# Patient Record
Sex: Male | Born: 1985
Health system: Southern US, Community
[De-identification: ages and names within clinical notes are randomized; demographics above are authoritative.]

## PROBLEM LIST (undated history)

## (undated) DIAGNOSIS — J45909 Unspecified asthma, uncomplicated: Secondary | ICD-10-CM

## (undated) HISTORY — PX: HEMORRHOID SURGERY: SHX153

---

## 2013-05-12 ENCOUNTER — Emergency Department (HOSPITAL_COMMUNITY)
Admission: EM | Admit: 2013-05-12 | Discharge: 2013-05-12 | Disposition: A | Discharge status: Home / Self Care | Payer: No Typology Code available for payment source | Attending: Emergency Medicine | Admitting: Emergency Medicine

## 2013-05-12 ENCOUNTER — Encounter (HOSPITAL_COMMUNITY): Payer: Self-pay | Admitting: Emergency Medicine

## 2013-05-12 DIAGNOSIS — Y929 Unspecified place or not applicable: Secondary | ICD-10-CM | POA: Insufficient documentation

## 2013-05-12 DIAGNOSIS — W57XXXA Bitten or stung by nonvenomous insect and other nonvenomous arthropods, initial encounter: Secondary | ICD-10-CM | POA: Insufficient documentation

## 2013-05-12 DIAGNOSIS — Y939 Activity, unspecified: Secondary | ICD-10-CM | POA: Insufficient documentation

## 2013-05-12 DIAGNOSIS — S40269A Insect bite (nonvenomous) of unspecified shoulder, initial encounter: Secondary | ICD-10-CM | POA: Insufficient documentation

## 2013-05-12 DIAGNOSIS — S30860A Insect bite (nonvenomous) of lower back and pelvis, initial encounter: Secondary | ICD-10-CM | POA: Insufficient documentation

## 2013-05-12 DIAGNOSIS — B379 Candidiasis, unspecified: Secondary | ICD-10-CM | POA: Insufficient documentation

## 2013-05-12 DIAGNOSIS — S90569A Insect bite (nonvenomous), unspecified ankle, initial encounter: Secondary | ICD-10-CM | POA: Insufficient documentation

## 2013-05-12 DIAGNOSIS — B372 Candidiasis of skin and nail: Secondary | ICD-10-CM

## 2013-05-12 MED ORDER — LIDOCAINE HCL (CARDIAC) 20 MG/ML IV SOLN
INTRAVENOUS | Status: AC
  Filled 2013-05-12: qty 5

## 2013-05-12 MED ORDER — ETOMIDATE 2 MG/ML IV SOLN
INTRAVENOUS | Status: AC
  Filled 2013-05-12: qty 20

## 2013-05-12 MED ORDER — SUCCINYLCHOLINE CHLORIDE 20 MG/ML IJ SOLN
INTRAMUSCULAR | Status: AC
  Filled 2013-05-12: qty 1

## 2013-05-12 MED ORDER — HYDROCORTISONE 2.5 % EX LOTN: TOPICAL_LOTION | Freq: Two times a day (BID) | CUTANEOUS | Status: DC

## 2013-05-12 MED ORDER — ROCURONIUM BROMIDE 50 MG/5ML IV SOLN
INTRAVENOUS | Status: AC
  Filled 2013-05-12: qty 2

## 2013-05-12 MED ORDER — TERBINAFINE HCL 1 % EX CREA: TOPICAL_CREAM | Freq: Two times a day (BID) | CUTANEOUS | Status: DC

## 2013-05-12 NOTE — ED Notes (Signed)
Pt here from [place]  one month ago  now has a rash of small bumps on his arms , legs and groin , pt states that they itch but do not hurt ,no drainage

## 2013-05-12 NOTE — ED Provider Notes (Signed)
CSN: [id number]     Arrival date & time 05/12/13  1640 History  This chart was scribed for non-physician practitioner Dierdre Forth, PA-C, working with Audree Camel, MD, by Yevette Edwards, ED Scribe. This patient was seen in room TR01C/TR01C and the patient's care was started at 5:23 PM.   First MD Initiated Contact with Patient 05/12/13 1710     Chief Complaint  Patient presents with  . Rash    Patient is a 27 y.o. male presenting with rash. The history is provided by the patient. The history is limited by a language barrier (family translating without difficulty). No language interpreter was used.  Rash Location:  Shoulder/arm, leg and ano-genital Quality: itchiness   Quality: not painful and not weeping   Relieved by:  None tried Ineffective treatments:  None tried Associated symptoms: no fever, no nausea and not vomiting    HPI Comments: Watson Adamou Ellis is a 27 y.o. male who presents to the Emergency Department complaining of a rash to his arms bilaterally, legs bilaterally, and groin. He has experienced the rash for approximately a year, and in the previous year the rash has gradually improved. The rash has not changed its appearance in the course of the year. The pt reports experiencing itching associated with the rash.  He denies experiencing any itching to his toes, but does have itching to his heels and ankles. He denies experiencing any pain or drainage associated with the rash. He also denies experiencing any fever, chills, nausea, or emesis. The pt has not attempted to mitigate the rash with any treatment. He denies any medical issues or taking any medication.   History reviewed. No pertinent past medical history. History reviewed. No pertinent past surgical history. No family history on file. History  Substance Use Topics  . Smoking status: Not on file  . Smokeless tobacco: Not on file  . Alcohol Use: No    Review of Systems  Constitutional: Negative for  fever and chills.  Gastrointestinal: Negative for nausea and vomiting.  Skin: Positive for rash.  All other systems reviewed and are negative.    Allergies  Review of patient's allergies indicates not on file.  Home Medications   Current Outpatient Rx  Name  Route  Sig  Dispense  Refill  . hydrocortisone 2.5 % lotion   Topical   Apply topically 2 (two) times daily. To the arms, legs and trunk.  Avoid the groin area completely   59 mL   0   . terbinafine (LAMISIL AT) 1 % cream   Topical   Apply topically 2 (two) times daily. To the groin area only   30 g   0     Triage Vitals: BP 142/79  Pulse 72  Temp(Src) 98.9 F (37.2 C) (Oral)  Resp 18  SpO2 100%  Physical Exam  Nursing note and vitals reviewed. Constitutional: He appears well-developed and well-nourished. No distress.  Awake, alert, nontoxic appearance  HENT:  Head: Normocephalic and atraumatic.  Mouth/Throat: Oropharynx is clear and moist. No oropharyngeal exudate.  Eyes: Conjunctivae are normal. No scleral icterus.  Neck: Normal range of motion. Neck supple.  Cardiovascular: Normal rate, regular rhythm, normal heart sounds and intact distal pulses.   No murmur heard. Pulmonary/Chest: Effort normal and breath sounds normal. No respiratory distress. He has no wheezes. He has no rales.  Abdominal: Soft. Bowel sounds are normal. He exhibits no distension and no mass. There is no tenderness. There is no rebound and no guarding.  Genitourinary:     Mildly erythematous plaques with fine scaling and several small satellite lesions located of the inguinal folds of the groin bilaterally. There is no rash on the scrotum.  Musculoskeletal: Normal range of motion. He exhibits no edema.  Neurological: He is alert.  Speech is clear and goal oriented Moves extremities without ataxia  Skin: Skin is warm and dry. Rash noted. He is not diaphoretic. There is erythema.  Scattered erythematous, excoriated papules over the  arms, legs, and trunk. There is no rash to the axilla.   Psychiatric: He has a normal mood and affect.    ED Course   DIAGNOSTIC STUDIES:  Oxygen Saturation is 100% on room air, normal by my interpretation.    COORDINATION OF CARE:  5:28 PM- Discussed treatment plan with patient which includes medication and a follow-up with a dermatologist, and the patient agreed to the plan.   Procedures (including critical care time)  Labs Reviewed - No data to display No results found. 1. Candidal intertrigo   2. Insect bites     MDM  Noris Adamou Ricke presents with 2 different rashes on the skin present for > 1 year. Trigone rash consistent with candida and without evidence of cellulitis.  Extremity and trunk rash is consistent with insect bites, but strictly consistent with scabies or bed bugs.  Neither rash is consistent with parasite infection, cellulitis or abscess.  Extremity rash is excoriated.  Will treat with hydrocortisone for the extremity and trunk rash and Lamisil for the trigone rash.  Pt advised not to confuse these as the hydrocortisone will make the groin rash worse.  Pt also given dermatology follow-up.  I have also discussed reasons to return immediately to the ER.  Patient expresses understanding and agrees with plan.    Dahlia Client Sandie Swayze, PA-C 05/12/13 1758

## 2013-05-13 NOTE — ED Provider Notes (Signed)
Medical screening examination/treatment/procedure(s) were performed by non-physician practitioner and as supervising physician I was immediately available for consultation/collaboration.   Yuval Nolet T Nikkol Pai, MD 05/13/13 0004 

## 2014-09-16 ENCOUNTER — Emergency Department (HOSPITAL_COMMUNITY)
Admission: EM | Admit: 2014-09-16 | Discharge: 2014-09-16 | Disposition: A | Discharge status: Home / Self Care | Payer: PRIVATE HEALTH INSURANCE | Attending: Emergency Medicine | Admitting: Emergency Medicine

## 2014-09-16 ENCOUNTER — Encounter (HOSPITAL_COMMUNITY): Payer: Self-pay | Admitting: *Deleted

## 2014-09-16 DIAGNOSIS — Z79899 Other long term (current) drug therapy: Secondary | ICD-10-CM | POA: Diagnosis not present

## 2014-09-16 DIAGNOSIS — R21 Rash and other nonspecific skin eruption: Secondary | ICD-10-CM | POA: Diagnosis not present

## 2014-09-16 MED ORDER — HYDROCORTISONE 2.5 % EX LOTN: TOPICAL_LOTION | Freq: Two times a day (BID) | CUTANEOUS | Status: DC

## 2014-09-16 NOTE — Discharge Instructions (Signed)
Return to the emergency room with worsening of symptoms, new symptoms or with symptoms that are concerning. Apply hydrocortisone lotion to the rash twice daily. Take benadryl for itching but it can make you drowsy. No driving, operating machinery drinking call or other sedating meds while taking Benadryl. You may take Claritin, Zyrtec, Allegra for the itching and it will not make you drowsy. Call to make an appointment with the wellness center to establish care. Do not scratch the area.  Contact Dermatitis Contact dermatitis is a rash that happens when something touches the skin. You touched something that irritates your skin, or you have allergies to something you touched. HOME CARE   Avoid the thing that caused your rash.  Keep your rash away from hot water, soap, sunlight, chemicals, and other things that might bother it.  Do not scratch your rash.  You can take cool baths to help stop itching.  Only take medicine as told by your doctor.  Keep all doctor visits as told. GET HELP RIGHT AWAY IF:   Your rash is not better after 3 days.  Your rash gets worse.  Your rash is puffy (swollen), tender, red, sore, or warm.  You have problems with your medicine. MAKE SURE YOU:   Understand these instructions.  Will watch your condition.  Will get help right away if you are not doing well or get worse. Document Released: 07/20/2009 Document Revised: 12/15/2011 Document Reviewed: 02/25/2011 [facility] Patient Information 2015 [place]. This information is not intended to replace advice given to you by your health care provider. Make sure you discuss any questions you have with your health care provider.

## 2014-09-16 NOTE — ED Provider Notes (Signed)
CSN: [id number]     Arrival date & time 09/16/14  1127 History   This chart was scribed for non-physician practitioner, Karmen Stabsori Marika Mahaffy, PA-C working with Gerhard Munchobert Lockwood, MD, by Jarvis Morganaylor Ferguson, ED Scribe. This patient was seen in room TR09C/TR09C and the patient's care was started at 12:26 PM.    Chief Complaint  Patient presents with  . Rash    The history is provided by the patient. A language interpreter was used.    HPI Comments: David Larsen is a 28 y.o. male with no PMH who presents to the Emergency Department complaining of a rash to the back of his neck. Pt states he has had a rash like this in the past but he has not been taking anything or applying any cream on the rash. He states that the rash is mildly itchy. He states that the rash is not painful. Pt notes there is some discharge from the rash but unable to specify was type of discharge. He denies using any new soap, deodorants, or detergents or new shirts. No insect bites. Pt reports that the rash is not on any other part of his body. He denies any fevers, chills, nausea or vomiting.    History reviewed. No pertinent past medical history. History reviewed. No pertinent past surgical history. History reviewed. No pertinent family history. History  Substance Use Topics  . Smoking status: Not on file  . Smokeless tobacco: Not on file  . Alcohol Use: No    Review of Systems  Constitutional: Negative for fever and chills.  Gastrointestinal: Negative for nausea and vomiting.  Skin: Positive for rash. Negative for color change and wound.      Allergies  Review of patient's allergies indicates no known allergies.  Home Medications   Prior to Admission medications   Medication Sig Start Date End Date Taking? Authorizing Provider  hydrocortisone 2.5 % lotion Apply topically 2 (two) times daily. 09/16/14   Louann SjogrenVictoria L Camyra Vaeth, PA-C  terbinafine (LAMISIL AT) 1 % cream Apply topically 2 (two) times daily. To the groin  area only 05/12/13   Dahlia ClientHannah Muthersbaugh, PA-C   Triage Vitals: BP 122/70 mmHg  Pulse 73  Temp(Src) 97.3 F (36.3 C) (Oral)  Resp 15  SpO2 96%  Physical Exam  Constitutional: He appears well-developed and well-nourished. No distress.  HENT:  Head: Normocephalic and atraumatic.  Mouth/Throat: Oropharynx is clear and moist.  No swelling, oral swelling or tongue swelling. No tenderness under tongue. No oral lesions.  Eyes: Conjunctivae are normal. Right eye exhibits no discharge. Left eye exhibits no discharge.  Pulmonary/Chest: Effort normal. No respiratory distress.  Neurological: He is alert. Coordination normal.  Skin: He is not diaphoretic.  2-3 mm raised, erythematous, circular, lesions without fluctuance or evidence of cellulitis. No discharge. Lesions soft and mobile. Lesions at bottom of hairline where collar of shirt contacts head.   Nursing note and vitals reviewed.   ED Course  Procedures (including critical care time)  DIAGNOSTIC STUDIES: Oxygen Saturation is 96% on RA, adequate by my interpretation.    COORDINATION OF CARE:    Labs Review Labs Reviewed - No data to display  Imaging Review No results found.   EKG Interpretation None      MDM   Final diagnoses:  Rash of neck   Patient with pruritic rash at collar line, likely due to contact dermatitis but know known cause identified. Lesions do not appear to be insect bites, abscesses, cellulitis.  No fevers. No difficulty breathing,  throat tightness. VSS. No mucous membrane involvement. Tx with cortisone cream, antihistamines and symptomatic treatment. Follow up with wellness center for persistent symptoms.  Discussed return precautions with patient. Discussed all results and patient verbalizes understanding and agrees with plan.  I personally performed the services described in this documentation, which was scribed in my presence. The recorded information has been reviewed and is accurate.    Louann SjogrenVictoria  L Dickie Labarre, PA-C 09/16/14 1303  Gerhard Munchobert Lockwood, MD 09/16/14 [id number]

## 2014-09-16 NOTE — ED Notes (Signed)
Pt reports rash to back of neck, hair line. Sometimes itching, painful, feel hot.

## 2016-12-12 ENCOUNTER — Emergency Department (HOSPITAL_COMMUNITY)
Admission: EM | Admit: 2016-12-12 | Discharge: 2016-12-12 | Disposition: A | Discharge status: Home / Self Care | Payer: BLUE CROSS/BLUE SHIELD | Attending: Emergency Medicine | Admitting: Emergency Medicine

## 2016-12-12 ENCOUNTER — Encounter (HOSPITAL_COMMUNITY): Payer: Self-pay | Admitting: Emergency Medicine

## 2016-12-12 DIAGNOSIS — L299 Pruritus, unspecified: Secondary | ICD-10-CM | POA: Insufficient documentation

## 2016-12-12 MED ORDER — FAMOTIDINE 20 MG PO TABS: 20.0000 mg | ORAL_TABLET | Freq: Two times a day (BID) | ORAL | 0 refills | Status: DC

## 2016-12-12 MED ORDER — HYDROXYZINE HCL 25 MG PO TABS: 25.0000 mg | ORAL_TABLET | Freq: Four times a day (QID) | ORAL | 0 refills | Status: DC

## 2016-12-12 MED ORDER — PREDNISONE 10 MG PO TABS: 20.0000 mg | ORAL_TABLET | Freq: Two times a day (BID) | ORAL | 0 refills | Status: DC

## 2016-12-12 NOTE — ED Provider Notes (Signed)
[facility]-EMERGENCY DEPT Provider Note   CSN: [id number] Arrival date & time: 12/12/16  1442  By signing my name below, I, Doreatha Martin, attest that this documentation has been prepared under the direction and in the presence of  [facility] M. Damian Leavell, NP. Electronically Signed: Doreatha Martin, ED Scribe. 12/12/16. 4:30 PM.    History   Chief Complaint Chief Complaint  Patient presents with  . Pruritis    HPI David Larsen is a 31 y.o. male who presents to the Emergency Department complaining of moderate generalized itching to the entire body for 1 month that worsened last night. No new soaps, lotions, detergents, foods, animals, plants, medications, clothing, cologne. Per pt, his itching began on his arms. Pt denies taking OTC medications at home to improve symptoms. He denies nausea, vomiting, fever, rash, throat swelling, sore throat, difficulty swallowing, rash/itching to web spacing of fingers.     The history is provided by the patient. No language interpreter was used.  Illness  This is a new problem. The current episode started more than 1 week ago. The problem occurs daily. The problem has been gradually worsening. Nothing aggravates the symptoms. Nothing relieves the symptoms. He has tried nothing for the symptoms. The treatment provided no relief.    History reviewed. No pertinent past medical history.  There are no active problems to display for this patient.   History reviewed. No pertinent surgical history.     Home Medications    Prior to Admission medications   Medication Sig Start Date End Date Taking? Authorizing Provider  famotidine (PEPCID) 20 MG tablet Take 1 tablet (20 mg total) by mouth 2 (two) times daily. 12/12/16   Erynn Vaca Orlene Och, NP  hydrocortisone 2.5 % lotion Apply topically 2 (two) times daily. 09/16/14   Oswaldo Conroy, PA-C  hydrOXYzine (ATARAX/VISTARIL) 25 MG tablet Take 1 tablet (25 mg total) by mouth every 6 (six) hours. 12/12/16   Jewell Haught Orlene Och, NP    predniSONE (DELTASONE) 10 MG tablet Take 2 tablets (20 mg total) by mouth 2 (two) times daily with a meal. 12/12/16   Deiondre Harrower Orlene Och, NP  terbinafine (LAMISIL AT) 1 % cream Apply topically 2 (two) times daily. To the groin area only 05/12/13   Dahlia Client Muthersbaugh, PA-C    Family History History reviewed. No pertinent family history.  Social History Social History  Substance Use Topics  . Smoking status: Not on file  . Smokeless tobacco: Not on file  . Alcohol use No     Allergies   Patient has no known allergies.   Review of Systems Review of Systems  Constitutional: Negative for fever.  HENT: Negative for sore throat and trouble swallowing.   Gastrointestinal: Negative for nausea and vomiting.  Skin: Negative for rash.       +diffuse itching     Physical Exam Updated Vital Signs BP 142/82 (BP Location: Right Arm)   Pulse 80   Temp 98.7 F (37.1 C) (Oral)   Resp 16   SpO2 96%   Physical Exam  Constitutional: He appears well-developed and well-nourished.  HENT:  Head: Normocephalic and atraumatic.  Mouth/Throat: Uvula is midline, oropharynx is clear and moist and mucous membranes are normal. No posterior oropharyngeal edema or posterior oropharyngeal erythema.  Patent airway   Eyes: EOM are normal. Pupils are equal, round, and reactive to light. No scleral icterus.  Neck: Normal range of motion.  Cardiovascular: Normal rate and regular rhythm.   Pulmonary/Chest: Effort normal and breath sounds normal.  No respiratory distress. He has no wheezes. He has no rales.  Genitourinary:  Genitourinary Comments: Small raised areas consistent with folliculitis in the pubic area. Chaperone present throughout entire exam.     Musculoskeletal: Normal range of motion.  Lymphadenopathy:    He has no cervical adenopathy.  Neurological: He is alert.  Skin: Skin is warm and dry.  Psychiatric: He has a normal mood and affect. His behavior is normal.  Nursing note and vitals  reviewed.    ED Treatments / Results   DIAGNOSTIC STUDIES: Oxygen Saturation is 96% on RA, adequate by my interpretation.    COORDINATION OF CARE: 4:29 PM Discussed treatment plan with pt at bedside which includes Dermatology f/u and pt agreed to plan.    Labs (all labs ordered are listed, but only abnormal results are displayed) Labs Reviewed - No data to display  Radiology No results found.  Procedures Procedures (including critical care time)  Medications Ordered in ED Medications - No data to display   Initial Impression / Assessment and Plan / ED Course  I have reviewed the triage vital signs and the nursing notes.    [name] presents to the ED for evaluation of itching that has been ongoing for over a month. Patient will be sent home with Atarax. Conservative therapies discussed and recommended. Patient advised to follow up with Dermatology. Patient appears stable for discharge at this time. Return precautions discussed and outlined in discharge paperwork. Patient is agreeable to plan.     Final Clinical Impressions(s) / ED Diagnoses   Final diagnoses:  Pruritus    New Prescriptions Discharge Medication List as of 12/12/2016  4:33 PM    START taking these medications   Details  famotidine (PEPCID) 20 MG tablet Take 1 tablet (20 mg total) by mouth 2 (two) times daily., Starting Fri 12/12/2016, Print    hydrOXYzine (ATARAX/VISTARIL) 25 MG tablet Take 1 tablet (25 mg total) by mouth every 6 (six) hours., Starting Fri 12/12/2016, Print    predniSONE (DELTASONE) 10 MG tablet Take 2 tablets (20 mg total) by mouth 2 (two) times daily with a meal., Starting Fri 12/12/2016, Print        I personally performed the services described in this documentation, which was scribed in my presence. The recorded information has been reviewed and is accurate.    [address]Hope WadsworthM Anayah Arvanitis, NP 12/16/16 0028    Gwyneth SproutWhitney Plunkett, MD 12/16/16 [phone]

## 2016-12-12 NOTE — ED Triage Notes (Signed)
Pt sts generalized itching x 2 months

## 2018-01-14 ENCOUNTER — Other Ambulatory Visit: Payer: Self-pay

## 2018-01-14 ENCOUNTER — Emergency Department (HOSPITAL_COMMUNITY)
Admission: EM | Admit: 2018-01-14 | Discharge: 2018-01-14 | Disposition: A | Discharge status: Home / Self Care | Payer: BLUE CROSS/BLUE SHIELD | Attending: Emergency Medicine | Admitting: Emergency Medicine

## 2018-01-14 ENCOUNTER — Encounter (HOSPITAL_COMMUNITY): Payer: Self-pay

## 2018-01-14 DIAGNOSIS — J45901 Unspecified asthma with (acute) exacerbation: Secondary | ICD-10-CM | POA: Diagnosis not present

## 2018-01-14 DIAGNOSIS — Z79899 Other long term (current) drug therapy: Secondary | ICD-10-CM | POA: Insufficient documentation

## 2018-01-14 DIAGNOSIS — J45909 Unspecified asthma, uncomplicated: Secondary | ICD-10-CM | POA: Diagnosis present

## 2018-01-14 HISTORY — DX: Unspecified asthma, uncomplicated: J45.909

## 2018-01-14 MED ORDER — ALBUTEROL SULFATE (2.5 MG/3ML) 0.083% IN NEBU
5.0000 mg | INHALATION_SOLUTION | Freq: Once | RESPIRATORY_TRACT | Status: AC
  Administered 2018-01-14: 5 mg via RESPIRATORY_TRACT
  Filled 2018-01-14: qty 6

## 2018-01-14 MED ORDER — ALBUTEROL SULFATE HFA 108 (90 BASE) MCG/ACT IN AERS: 1.0000 | INHALATION_SPRAY | Freq: Four times a day (QID) | RESPIRATORY_TRACT | 0 refills | Status: DC | PRN

## 2018-01-14 MED ORDER — BUDESONIDE-FORMOTEROL FUMARATE 80-4.5 MCG/ACT IN AERO: 2.0000 | INHALATION_SPRAY | Freq: Every day | RESPIRATORY_TRACT | 12 refills | Status: DC

## 2018-01-14 MED ORDER — BUDESONIDE-FORMOTEROL FUMARATE 80-4.5 MCG/ACT IN AERO: 2.0000 | INHALATION_SPRAY | Freq: Every day | RESPIRATORY_TRACT | 1 refills | Status: DC

## 2018-01-14 NOTE — ED Provider Notes (Signed)
[facility] EMERGENCY DEPARTMENT Provider Note   CSN: [id number] Arrival date & time: 01/14/18  0957     History   Chief Complaint Chief Complaint  Patient presents with  . Asthma    HPI Jancarlos Adamou Beasley is a 32 y.o. male.  HPI   32 year old male presents today with complaints of asthma exacerbation.  Patient notes that symptoms started overnight when he was sleeping with tightness in the chest and wheezing.  He notes he has been using his albuterol at home but has run out of his Symbicort which usually helps.  Patient denies any chest pain, denies any fever upper respiratory congestion.  Patient notes this is typical of previous asthma exacerbations.  Patient received a breathing treatment prior to my evaluation reporting that has significantly improved his symptoms.  He denies any significant shortness of breath at this time. Past Medical History:  Diagnosis Date  . Asthma     There are no active problems to display for this patient.   History reviewed. No pertinent surgical history.      Home Medications    Prior to Admission medications   Medication Sig Start Date End Date Taking? Authorizing Provider  albuterol (PROVENTIL HFA;VENTOLIN HFA) 108 (90 Base) MCG/ACT inhaler Inhale 1-2 puffs into the lungs every 6 (six) hours as needed for wheezing or shortness of breath. 01/14/18   Dekota Kirlin, Tinnie Gens, PA-C  budesonide-formoterol (SYMBICORT) 80-4.5 MCG/ACT inhaler Inhale 2 puffs into the lungs daily. 01/14/18   Alexsia Klindt, Tinnie Gens, PA-C  famotidine (PEPCID) 20 MG tablet Take 1 tablet (20 mg total) by mouth 2 (two) times daily. 12/12/16   Janne Napoleon, NP  hydrocortisone 2.5 % lotion Apply topically 2 (two) times daily. 09/16/14   Oswaldo Conroy, PA-C  hydrOXYzine (ATARAX/VISTARIL) 25 MG tablet Take 1 tablet (25 mg total) by mouth every 6 (six) hours. 12/12/16   Janne Napoleon, NP  predniSONE (DELTASONE) 10 MG tablet Take 2 tablets (20 mg total) by mouth 2 (two)  times daily with a meal. 12/12/16   Neese, [place], NP  terbinafine (LAMISIL AT) 1 % cream Apply topically 2 (two) times daily. To the groin area only 05/12/13   Muthersbaugh, Dahlia Client, PA-C    Family History No family history on file.  Social History Social History   Tobacco Use  . Smoking status: Not on file  Substance Use Topics  . Alcohol use: No  . Drug use: Not on file     Allergies   Patient has no known allergies.   Review of Systems Review of Systems  All other systems reviewed and are negative.  Physical Exam Updated Vital Signs BP (!) 134/93 (BP Location: Left Arm)   Pulse 77   Temp 98.6 F (37 C) (Oral)   Resp 18   SpO2 95%   Physical Exam  Constitutional: He is oriented to person, place, and time. He appears well-developed and well-nourished.  HENT:  Head: Normocephalic and atraumatic.  Eyes: Pupils are equal, round, and reactive to light. Conjunctivae are normal. Right eye exhibits no discharge. Left eye exhibits no discharge. No scleral icterus.  Neck: Normal range of motion. No JVD present. No tracheal deviation present.  Cardiovascular: Normal rate, regular rhythm and normal heart sounds.  Pulmonary/Chest: Effort normal and breath sounds normal. No stridor. No respiratory distress. He has no wheezes. He has no rales. He exhibits no tenderness.  Neurological: He is alert and oriented to person, place, and time. Coordination normal.  Psychiatric: He  has a normal mood and affect. His behavior is normal. Judgment and thought content normal.  Nursing note and vitals reviewed.    ED Treatments / Results  Labs (all labs ordered are listed, but only abnormal results are displayed) Labs Reviewed - No data to display  EKG None  Radiology No results found.  Procedures Procedures (including critical care time)  Medications Ordered in ED Medications  albuterol (PROVENTIL) (2.5 MG/3ML) 0.083% nebulizer solution 5 mg (5 mg Nebulization Given 01/14/18 1126)       Initial Impression / Assessment and Plan / ED Course  I have reviewed the triage vital signs and the nursing notes.  Pertinent labs & imaging results that were available during my care of the patient were reviewed by me and considered in my medical decision making (see chart for details).      Final Clinical Impressions(s) / ED Diagnoses   Final diagnoses:  Mild asthma with exacerbation, unspecified whether persistent    Labs:   Imaging:  Consults:  Therapeutics: Albuterol  Discharge Meds:   Assessment/Plan: 32 year old male presents today with asthma exacerbation.  This appears very minor as he has no significant wheeze on my exam.  Patient is well-appearing in no acute distress, low suspicion for any infectious etiology.  Patient will have a refill of his Symbicort and albuterol encouraged to return immediately with any new or worsening signs or symptoms.  Patient verbalized understanding and agreement to today's plan had no further questions or concerns at the time of discharge.      ED Discharge Orders        Ordered    budesonide-formoterol (SYMBICORT) 80-4.5 MCG/ACT inhaler  Daily,   Status:  Discontinued     01/14/18 1344    albuterol (PROVENTIL HFA;VENTOLIN HFA) 108 (90 Base) MCG/ACT inhaler  Every 6 hours PRN     01/14/18 1345    budesonide-formoterol (SYMBICORT) 80-4.5 MCG/ACT inhaler  Daily     01/14/18 1346       Rosalio LoudHedges, Ikeya Brockel, PA-C 01/14/18 1632    Azalia Bilisampos, Kevin, MD 01/14/18 2309

## 2018-01-14 NOTE — Discharge Instructions (Addendum)
Please read attached information. If you experience any new or worsening signs or symptoms please return to the emergency room for evaluation. Please follow-up with your primary care provider or specialist as discussed. Please use medication prescribed only as directed and discontinue taking if you have any concerning signs or symptoms.   °

## 2018-01-14 NOTE — ED Triage Notes (Signed)
Pt presents for evaluation of asthma exacerbation. Pt reports out of inhaler, wheezing started yesterday. Speaking in complete sentences.

## 2018-06-26 ENCOUNTER — Other Ambulatory Visit: Payer: Self-pay

## 2018-06-26 ENCOUNTER — Emergency Department (HOSPITAL_COMMUNITY)
Admission: EM | Admit: 2018-06-26 | Discharge: 2018-06-26 | Disposition: A | Discharge status: Home / Self Care | Payer: BLUE CROSS/BLUE SHIELD | Attending: Emergency Medicine | Admitting: Emergency Medicine

## 2018-06-26 DIAGNOSIS — J45909 Unspecified asthma, uncomplicated: Secondary | ICD-10-CM | POA: Diagnosis present

## 2018-06-26 DIAGNOSIS — Z79899 Other long term (current) drug therapy: Secondary | ICD-10-CM | POA: Insufficient documentation

## 2018-06-26 DIAGNOSIS — J4520 Mild intermittent asthma, uncomplicated: Secondary | ICD-10-CM | POA: Diagnosis not present

## 2018-06-26 MED ORDER — ALBUTEROL SULFATE (2.5 MG/3ML) 0.083% IN NEBU
5.0000 mg | INHALATION_SOLUTION | Freq: Once | RESPIRATORY_TRACT | Status: AC
  Administered 2018-06-26: 5 mg via RESPIRATORY_TRACT
  Filled 2018-06-26: qty 6

## 2018-06-26 MED ORDER — PREDNISONE 50 MG PO TABS: ORAL_TABLET | ORAL | 0 refills | Status: DC

## 2018-06-26 MED ORDER — BENZONATATE 100 MG PO CAPS: 100.0000 mg | ORAL_CAPSULE | Freq: Three times a day (TID) | ORAL | 0 refills | Status: DC

## 2018-06-26 MED ORDER — ALBUTEROL SULFATE HFA 108 (90 BASE) MCG/ACT IN AERS
2.0000 | INHALATION_SPRAY | RESPIRATORY_TRACT | Status: DC | PRN
  Administered 2018-06-26: 2 via RESPIRATORY_TRACT
  Filled 2018-06-26: qty 6.7

## 2018-06-26 MED ORDER — PREDNISONE 20 MG PO TABS
60.0000 mg | ORAL_TABLET | Freq: Once | ORAL | Status: AC
  Administered 2018-06-26: 60 mg via ORAL
  Filled 2018-06-26: qty 3

## 2018-06-26 MED ORDER — IPRATROPIUM-ALBUTEROL 0.5-2.5 (3) MG/3ML IN SOLN
3.0000 mL | Freq: Once | RESPIRATORY_TRACT | Status: AC
  Administered 2018-06-26: 3 mL via RESPIRATORY_TRACT
  Filled 2018-06-26: qty 3

## 2018-06-26 NOTE — Discharge Instructions (Signed)
[facility]

## 2018-06-26 NOTE — ED Triage Notes (Addendum)
Patient states that he is having "breathing problems" from his asthma. Has been out of his medication for 2-3 months. Went to a pharmacy and was told that he has to have a prescription. Audible wheezing present.

## 2018-06-26 NOTE — ED Provider Notes (Signed)
[facility] EMERGENCY DEPARTMENT Provider Note   CSN: [id number] Arrival date & time: 06/26/18  2016     History   Chief Complaint Chief Complaint  Patient presents with  . Asthma    HPI David Larsen is a 32 y.o. male who presents to the ED with asthma. Patient states that he is having "breathing problems" from his asthma. Has been out of his medication for 2-3 months. Went to a pharmacy and was told that he has to have a prescription.   HPI  Past Medical History:  Diagnosis Date  . Asthma     There are no active problems to display for this patient.   No past surgical history on file.      Home Medications    Prior to Admission medications   Medication Sig Start Date End Date Taking? Authorizing Provider  albuterol (PROVENTIL HFA;VENTOLIN HFA) 108 (90 Base) MCG/ACT inhaler Inhale 1-2 puffs into the lungs every 6 (six) hours as needed for wheezing or shortness of breath. 01/14/18   Hedges, Tinnie Gens, PA-C  benzonatate (TESSALON) 100 MG capsule Take 1 capsule (100 mg total) by mouth every 8 (eight) hours. 06/26/18   Janne Napoleon, NP  budesonide-formoterol [facility]) 80-4.5 MCG/ACT inhaler Inhale 2 puffs into the lungs daily. 01/14/18   Hedges, Tinnie Gens, PA-C  famotidine (PEPCID) 20 MG tablet Take 1 tablet (20 mg total) by mouth 2 (two) times daily. 12/12/16   Janne Napoleon, NP  hydrocortisone 2.5 % lotion Apply topically 2 (two) times daily. 09/16/14   Oswaldo Conroy, PA-C  hydrOXYzine (ATARAX/VISTARIL) 25 MG tablet Take 1 tablet (25 mg total) by mouth every 6 (six) hours. 12/12/16   Janne Napoleon, NP  predniSONE (DELTASONE) 50 MG tablet Starting 06/27/09 take one tablet PO daily 06/26/18   Janne Napoleon, NP  terbinafine (LAMISIL AT) 1 % cream Apply topically 2 (two) times daily. To the groin area only 05/12/13   Muthersbaugh, Dahlia Client, PA-C    Family History No family history on file.  Social History Social History   Tobacco Use  . Smoking status:  Not on file  Substance Use Topics  . Alcohol use: No  . Drug use: Not on file     Allergies   Patient has no known allergies.   Review of Systems Review of Systems  Respiratory: Positive for cough, shortness of breath and wheezing.   All other systems reviewed and are negative.    Physical Exam Updated Vital Signs BP (!) 169/93 (BP Location: Right Wrist)   Pulse 85   Temp 97.8 F (36.6 C)   Resp 20   Ht 6' (1.829 m)   Wt 104.3 kg   SpO2 98%   BMI 31.19 kg/m   Physical Exam  Constitutional: He appears well-developed and well-nourished. No distress.  HENT:  Head: Normocephalic.  Eyes: Conjunctivae and EOM are normal.  Neck: Normal range of motion. Neck supple.  Cardiovascular: Normal rate and regular rhythm.  Pulmonary/Chest: Effort normal. No stridor. No respiratory distress. He has decreased breath sounds. He has wheezes.  Musculoskeletal: Normal range of motion.  Neurological: He is alert.  Skin: Skin is warm and dry.  Psychiatric: He has a normal mood and affect.  Nursing note and vitals reviewed.    ED Treatments / Results  Labs (all labs ordered are listed, but only abnormal results are displayed) Labs Reviewed - No data to display Radiology No results found.  Procedures Procedures (including critical care time)  Medications Ordered in ED Medications  albuterol (PROVENTIL HFA;VENTOLIN HFA) 108 (90 Base) MCG/ACT inhaler 2 puff (2 puffs Inhalation Given 06/26/18 2130)  albuterol (PROVENTIL) (2.5 MG/3ML) 0.083% nebulizer solution 5 mg (5 mg Nebulization Given 06/26/18 2027)  ipratropium-albuterol (DUONEB) 0.5-2.5 (3) MG/3ML nebulizer solution 3 mL (3 mLs Nebulization Given 06/26/18 2130)  predniSONE (DELTASONE) tablet 60 mg (60 mg Oral Given 06/26/18 2130)     Initial Impression / Assessment and Plan / ED Course  I have reviewed the triage vital signs and the nursing notes. 32 y.o. male here with asthma and need for refill of medication stable for d/c  with improved symptoms after 2 neb treatments. Patient d/c home with prednisone, tessalon and albuterol inhaler. [facility] He will return here for worsening symptoms.   Final Clinical Impressions(s) / ED Diagnoses   Final diagnoses:  Mild intermittent asthma without complication    ED Discharge Orders         Ordered    predniSONE (DELTASONE) 50 MG tablet     06/26/18 2154    benzonatate (TESSALON) 100 MG capsule  Every 8 hours     06/26/18 2154           Kerrie Buffaloeese, Hope PirtlevilleM, NP 06/27/18 [id number]    Alvira MondaySchlossman, Erin, MD 06/27/18 1332

## 2018-10-07 ENCOUNTER — Encounter: Payer: Self-pay | Admitting: Internal Medicine

## 2018-10-07 ENCOUNTER — Other Ambulatory Visit: Payer: Self-pay

## 2018-10-07 ENCOUNTER — Ambulatory Visit: Payer: BLUE CROSS/BLUE SHIELD | Admitting: Internal Medicine

## 2018-10-07 VITALS — BP 136/76 | HR 93 | Temp 98.4°F | Ht 69.0 in | Wt 292.8 lb

## 2018-10-07 DIAGNOSIS — J4530 Mild persistent asthma, uncomplicated: Secondary | ICD-10-CM

## 2018-10-07 DIAGNOSIS — B356 Tinea cruris: Secondary | ICD-10-CM

## 2018-10-07 DIAGNOSIS — Z0000 Encounter for general adult medical examination without abnormal findings: Secondary | ICD-10-CM

## 2018-10-07 LAB — POCT URINALYSIS DIPSTICK
Bilirubin, UA: NEGATIVE
Glucose, UA: NEGATIVE
Ketones, UA: NEGATIVE
Leukocytes, UA: NEGATIVE
Nitrite, UA: NEGATIVE
Protein, UA: NEGATIVE
Spec Grav, UA: 1.015 (ref 1.010–1.025)
Urobilinogen, UA: 0.2 E.U./dL
pH, UA: 6 (ref 5.0–8.0)

## 2018-10-07 MED ORDER — BUDESONIDE-FORMOTEROL FUMARATE 80-4.5 MCG/ACT IN AERO: 2.0000 | INHALATION_SPRAY | Freq: Every day | RESPIRATORY_TRACT | 1 refills | Status: DC

## 2018-10-07 MED ORDER — ALBUTEROL SULFATE HFA 108 (90 BASE) MCG/ACT IN AERS: 2.0000 | INHALATION_SPRAY | Freq: Four times a day (QID) | RESPIRATORY_TRACT | 1 refills | Status: DC | PRN

## 2018-10-07 MED ORDER — NYSTATIN 100000 UNIT/GM EX CREA: 1.0000 "application " | TOPICAL_CREAM | Freq: Two times a day (BID) | CUTANEOUS | 0 refills | Status: AC

## 2018-10-07 NOTE — Progress Notes (Signed)
Subjective:     Patient ID: David Larsen , male    DOB: 03/28/1986 , 33 y.o.   MRN: [id number]   Chief Complaint  Patient presents with  . New Patient (Initial Visit)    would like to get bloodwork done today     HPI 1-Pt is here to establish care with us. Needs refills of his inhalers. He used to have to use the Symbicort every day, but has not had to for 3 months and is doing well. At night time, sometimes his nose gets stuffy, but he uses a nose spray prn and helps.  2- He also gets a rash that itches on his L groin and would like to have this looked at.   Past Medical History:  Diagnosis Date  . Asthma    L thigh injury  As baby.   History reviewed. No pertinent family history.   Current Outpatient Medications:  .  albuterol (PROVENTIL HFA;VENTOLIN HFA) 108 (90 Base) MCG/ACT inhaler, Inhale 1-2 puffs into the lungs every 6 (six) hours as needed for wheezing or shortness of breath., Disp: 1 Inhaler, Rfl: 0 .  benzonatate (TESSALON) 100 MG capsule, Take 1 capsule (100 mg total) by mouth every 8 (eight) hours., Disp: 21 capsule, Rfl: 0 .  budesonide-formoterol (SYMBICORT) 80-4.5 MCG/ACT inhaler, Inhale 2 puffs into the lungs daily., Disp: 1 Inhaler, Rfl: 1 .  famotidine (PEPCID) 20 MG tablet, Take 1 tablet (20 mg total) by mouth 2 (two) times daily., Disp: 30 tablet, Rfl: 0   No Known Allergies   Review of Systems  Constitutional: Negative for appetite change, chills, fatigue and fever.  HENT: Positive for congestion, postnasal drip and rhinorrhea. Negative for nosebleeds.   Respiratory: Negative for cough, shortness of breath and wheezing.   Endocrine: Negative for polyphagia.  Skin: Positive for rash.       Gets intermittent rash in his groin which itches off and on. He wears polyester underwear   Allergic/Immunologic: Positive for environmental allergies.  Neurological: Negative for headaches.     Today's Vitals   10/07/18 1146  BP: 136/76  Pulse: 93  Temp:  98.4 F (36.9 C)  TempSrc: Oral  SpO2: 96%  Weight: 292 lb 12.8 oz (132.8 kg)  Height: 5\' 9"  (1.753 m)   Body mass index is 43.24 kg/m.   Objective:  Physical Exam Vitals signs and nursing note reviewed.  Constitutional:      Appearance: Normal appearance. He is obese.     Comments: He has a hard time understanding me and me him  HENT:     Head: Normocephalic.     Right Ear: Tympanic membrane and ear canal normal.     Left Ear: Tympanic membrane and ear canal normal.     Mouth/Throat:     Mouth: Mucous membranes are moist.  Eyes:     General: No scleral icterus.    Conjunctiva/sclera: Conjunctivae normal.  Neck:     Musculoskeletal: Normal range of motion and neck supple.  Cardiovascular:     Rate and Rhythm: Normal rate and regular rhythm.     Heart sounds: No murmur.  Pulmonary:     Effort: Pulmonary effort is normal.     Breath sounds: No wheezing, rhonchi or rales.  Musculoskeletal: Normal range of motion.  Lymphadenopathy:     Cervical: No cervical adenopathy.  Skin:    General: Skin is warm and dry.     Findings: Rash present.     Comments: Has slightly  hyperpigmented rash on L groin   Neurological:     Mental Status: He is alert and oriented to person, place, and time.  Psychiatric:        Mood and Affect: Mood normal.        Behavior: Behavior normal.     Assessment And Plan:    1. Mild persistent asthma without complication- stable. I refilled his inhalers and explained to him how each one works. Since his asthma is stable, [facility]. If he tends to have flairs during certain seasons, I recommended him to stay on it during that time.   2. Tinea cruris- acute. Placed on Nystatin cream bid x 7 days, then bid. Told needs to change polyester underwear to cotton.  - POCT Urinalysis Dipstick (81002)    Ikaika Showers RODRIGUEZ-SOUTHWORTH, PA-C

## 2018-10-07 NOTE — Patient Instructions (Signed)
Get 100% cotton underwear, the one you are wearing is polyester and makes you sweat more.   Jock Itch  Jock itch is an itchy rash in the groin and upper thigh area. It is a skin infection that is caused by a type of germ that lives in dark, damp places (fungus). The rash usually goes away in 2-3 weeks with treatment. Follow these instructions at home: Skin care  Use skin creams, ointments, or powders exactly as told by your doctor.  Wear loose-fitting clothes. Clothes should not rub against your groin area. Men should wear boxer shorts or loose-fitting underwear.  Keep your groin area clean and dry. ? Change your underwear every day. ? Change out of wet bathing suits as soon as you can. ? After bathing, use a separate towel to dry your groin area. Dry the area gently and completely.  Avoid hot baths and showers. Hot water can make itching worse.  Do not scratch the area. General instructions  Take and apply over-the-counter and prescription medicines only as told by your doctor.  Do not share towels or clothing with other people.  Wash your hands often with soap and water, especially after touching your groin area. If you do not have soap and water, use alcohol-based hand sanitizer. Contact a doctor if:  Your rash: ? Gets worse. ? Does not get better after 2 weeks of treatment. ? Spreads. ? Comes back after treatment is done.  You have any of the following: ? A fever. ? New or worsening redness, swelling, or pain around your rash. ? Fluid, blood, or pus coming from your rash. Summary  Jock itch is an itchy rash. It affects the groin and upper thigh area.  Jock itch usually goes away in 2-3 weeks with treatment.  Keep your groin area clean and dry. This information is not intended to replace advice given to you by your health care provider. Make sure you discuss any questions you have with your health care provider. Document Released: 12/17/2009 Document Revised:  09/02/2017 Document Reviewed: 09/02/2017 Elsevier Interactive Patient Education  2019 [employer].

## 2018-11-02 ENCOUNTER — Encounter: Payer: BLUE CROSS/BLUE SHIELD | Admitting: Internal Medicine

## 2019-05-26 ENCOUNTER — Other Ambulatory Visit: Payer: Self-pay

## 2019-05-26 ENCOUNTER — Encounter: Payer: Self-pay | Admitting: Internal Medicine

## 2019-05-26 ENCOUNTER — Ambulatory Visit: Payer: BC Managed Care – PPO | Admitting: Internal Medicine

## 2019-05-26 VITALS — BP 130/80 | HR 79 | Temp 98.4°F | Ht 70.2 in | Wt 294.0 lb

## 2019-05-26 DIAGNOSIS — J4530 Mild persistent asthma, uncomplicated: Secondary | ICD-10-CM

## 2019-05-26 MED ORDER — BUDESONIDE-FORMOTEROL FUMARATE 80-4.5 MCG/ACT IN AERO: 2.0000 | INHALATION_SPRAY | Freq: Every day | RESPIRATORY_TRACT | 2 refills | Status: DC

## 2019-05-26 MED ORDER — ALBUTEROL SULFATE HFA 108 (90 BASE) MCG/ACT IN AERS: 2.0000 | INHALATION_SPRAY | Freq: Four times a day (QID) | RESPIRATORY_TRACT | 1 refills | Status: DC | PRN

## 2019-05-26 MED ORDER — BUDESONIDE-FORMOTEROL FUMARATE 80-4.5 MCG/ACT IN AERO: 2.0000 | INHALATION_SPRAY | Freq: Every day | RESPIRATORY_TRACT | 1 refills | Status: DC

## 2019-05-26 MED ORDER — ALBUTEROL SULFATE HFA 108 (90 BASE) MCG/ACT IN AERS: 2.0000 | INHALATION_SPRAY | Freq: Four times a day (QID) | RESPIRATORY_TRACT | 3 refills | Status: DC | PRN

## 2019-05-26 NOTE — Progress Notes (Signed)
Subjective:     Patient ID: David Larsen , male    DOB: 08/11/1986 , 33 y.o.   MRN: [id number]   Chief Complaint  Patient presents with  . Asthma    HPI Here to have asthma Fu and get extra refills since [place] going to [place]. Sometimes his asthma bothers him and cant associated it with anything in particular.  Is tolerating his inhalers and uses then daily. Sometimes he wakes up wheezing, but no often. He does not use his Symbicort daily, and sometimes goes a month without needing it.    Past Medical History:  Diagnosis Date  . Asthma      Family History  Problem Relation Age of Onset  . Hypertension Father      Current Outpatient Medications:  .  famotidine (PEPCID) 20 MG tablet, Take 1 tablet (20 mg total) by mouth 2 (two) times daily., Disp: 30 tablet, Rfl: 0 .  albuterol (VENTOLIN HFA) 108 (90 Base) MCG/ACT inhaler, Inhale 2 puffs into the lungs every 6 (six) hours as needed for wheezing or shortness of breath., Disp: 18 g, Rfl: 3 .  benzonatate (TESSALON) 100 MG capsule, Take 1 capsule (100 mg total) by mouth every 8 (eight) hours. (Patient not taking: Reported on 05/26/2019), Disp: 21 capsule, Rfl: 0 .  budesonide-formoterol (SYMBICORT) 80-4.5 MCG/ACT inhaler, Inhale 2 puffs into the lungs daily., Disp: 3 Inhaler, Rfl: 2   No Known Allergies   Review of Systems  No fever, chills, sneezing, SOB, wheezing, cough, chest pain, rashes. Sometimes gets itching off and on but no rashes present.  Today's Vitals   05/26/19 1601  BP: 130/80  Pulse: 79  Temp: 98.4 F (36.9 C)  TempSrc: Oral  SpO2: 97%  Weight: 294 lb (133.4 kg)  Height: 5' 10.2" (1.783 m)   Body mass index is 41.94 kg/m.   Objective:  Physical Exam Vitals signs and nursing note reviewed.  Constitutional:      General: He is not in acute distress.    Appearance: He is obese. He is not toxic-appearing.  HENT:     Right Ear: External ear normal.     Left Ear: External ear normal.   Nose: Nose normal.     Mouth/Throat:     Mouth: Mucous membranes are moist.     Pharynx: Oropharynx is clear.  Eyes:     General: No scleral icterus.    Conjunctiva/sclera: Conjunctivae normal.  Neck:     Musculoskeletal: Neck supple.  Cardiovascular:     Rate and Rhythm: Normal rate and regular rhythm.     Heart sounds: Normal heart sounds. No murmur.  Pulmonary:     Effort: Pulmonary effort is normal.     Breath sounds: Normal breath sounds.  Musculoskeletal: Normal range of motion.  Skin:    General: Skin is warm.     Findings: No rash.  Neurological:     Mental Status: He is alert and oriented to person, place, and time.     Gait: Gait normal.  Psychiatric:        Mood and Affect: Mood normal.        Behavior: Behavior normal.        Thought Content: Thought content normal.        Judgment: Judgment normal.         Assessment And Plan:    1. Mild persistent asthma without complication     He was educated how to use his Symbicort  specially on season's where his asthma  I spent 50% of the visit educating him using a translator how to use his inhalers properly. Refills were sent.    Javon Snee RODRIGUEZ-SOUTHWORTH, PA-C    THE PATIENT IS ENCOURAGED TO PRACTICE SOCIAL DISTANCING DUE TO THE COVID-19 PANDEMIC.

## 2019-06-02 ENCOUNTER — Other Ambulatory Visit: Payer: Self-pay | Admitting: Internal Medicine

## 2019-06-02 DIAGNOSIS — Z139 Encounter for screening, unspecified: Secondary | ICD-10-CM

## 2019-06-02 NOTE — Progress Notes (Signed)
Pt stopped by and asked to have Covid test done before he leaves the country. The order was placed

## 2019-10-31 ENCOUNTER — Ambulatory Visit: Payer: BC Managed Care – PPO | Admitting: Nurse Practitioner

## 2019-10-31 ENCOUNTER — Encounter: Payer: Self-pay | Admitting: Nurse Practitioner

## 2019-10-31 ENCOUNTER — Other Ambulatory Visit: Payer: Self-pay

## 2019-10-31 VITALS — BP 118/80 | HR 95 | Temp 98.3°F | Ht 70.2 in | Wt 297.8 lb

## 2019-10-31 DIAGNOSIS — J4530 Mild persistent asthma, uncomplicated: Secondary | ICD-10-CM | POA: Diagnosis not present

## 2019-10-31 DIAGNOSIS — R195 Other fecal abnormalities: Secondary | ICD-10-CM | POA: Diagnosis not present

## 2019-10-31 DIAGNOSIS — K625 Hemorrhage of anus and rectum: Secondary | ICD-10-CM

## 2019-10-31 MED ORDER — HYDROCORTISONE ACETATE 25 MG RE SUPP
25.0000 mg | Freq: Two times a day (BID) | RECTAL | 0 refills | Status: DC
Start: 2019-10-31 — End: 2020-01-03

## 2019-10-31 NOTE — Patient Instructions (Signed)
   Take benefiber 1 tsp in juice or water daily   Will refer to GI for further evaluation due to the amount of blood on tissue with picture.

## 2019-10-31 NOTE — Progress Notes (Signed)
This visit occurred during the SARS-CoV-2 public health emergency.  Safety protocols were in place, including screening questions prior to the visit, additional usage of staff PPE, and extensive cleaning of exam room while observing appropriate contact time as indicated for disinfecting solutions.  Subjective:     Patient ID: David Larsen , male    DOB: 03/27/1986 , 33 y.o.   MRN: [id number]   Chief Complaint  Patient presents with  . Blood In Stools    patient stated when he has BMs he sees blood in the toliet water and also on the tissue  . Asthma    HPI  [place] - language.    Asthma There is no chest tightness. This is a chronic problem. The current episode started more than 1 year ago. The problem has been unchanged. Pertinent negatives include no chest pain or headaches. His symptoms are alleviated by beta-agonist and ipratropium. His past medical history is significant for asthma.  Constipation This is a new problem. The current episode started in the past 7 days. The problem has been gradually worsening since onset. The patient is not on a high fiber diet. He does not exercise regularly. There has not been adequate water intake. Pertinent negatives include no abdominal pain or hemorrhoids.     Past Medical History:  Diagnosis Date  . Asthma      Family History  Problem Relation Age of Onset  . Hypertension Father      Current Outpatient Medications:  .  albuterol (VENTOLIN HFA) 108 (90 Base) MCG/ACT inhaler, Inhale 2 puffs into the lungs every 6 (six) hours as needed for wheezing or shortness of breath., Disp: 54 g, Rfl: 1 .  budesonide-formoterol (SYMBICORT) 80-4.5 MCG/ACT inhaler, Inhale 2 puffs into the lungs daily., Disp: 3 Inhaler, Rfl: 1 .  famotidine (PEPCID) 20 MG tablet, Take 1 tablet (20 mg total) by mouth 2 (two) times daily., Disp: 30 tablet, Rfl: 0 .  benzonatate (TESSALON) 100 MG capsule, Take 1 capsule (100 mg total) by mouth every 8 (eight) hours.  (Patient not taking: Reported on 05/26/2019), Disp: 21 capsule, Rfl: 0   No Known Allergies   Review of Systems  Constitutional: Negative.   Respiratory: Negative.   Cardiovascular: Negative.  Negative for chest pain, palpitations and leg swelling.  Gastrointestinal: Positive for constipation. Negative for abdominal pain and hemorrhoids.  Neurological: Negative for dizziness and headaches.  Psychiatric/Behavioral: Negative.      Today's Vitals   10/31/19 0946  BP: 118/80  Pulse: 95  Temp: 98.3 F (36.8 C)  TempSrc: Oral  Weight: 297 lb 12.8 oz (135.1 kg)  Height: 5' 10.2" (1.783 m)  PainSc: 6    Body mass index is 42.49 kg/m.   Objective:  Physical Exam Constitutional:      Appearance: Normal appearance.  Cardiovascular:     Pulses: Normal pulses.     Heart sounds: Normal heart sounds. No murmur.  Pulmonary:     Effort: Pulmonary effort is normal. No respiratory distress.     Breath sounds: Normal breath sounds.  Abdominal:     Palpations: Abdomen is soft.  Neurological:     General: No focal deficit present.     Mental Status: He is alert and oriented to person, place, and time.  Psychiatric:        Mood and Affect: Mood normal.        Behavior: Behavior normal.        Thought Content: Thought content normal.          Judgment: Judgment normal.         Assessment And Plan:     1. Rectal bleeding  Seen bright red on picture on his tissue  No notable blood noted to rectal vault  Will check blood levels  Discussed eating more fiber and drinking adequate water, I am unsure if this is related to hemorrhoids. - CBC with Differential/Platelet - CMP14+EGFR - hydrocortisone (ANUSOL-HC) 25 MG suppository; Place 1 suppository (25 mg total) rectally 2 (two) times daily.  Dispense: 12 suppository; Refill: 0  2. Occult blood positive stool  Positive hemoccult  I did give him a prescription for anusol and will refer to GI - Ambulatory referral to [facility]  logy  3. Mild persistent asthma without complication No active wheezing and he reports he is doing better Minette Brine, FNP    THE PATIENT IS ENCOURAGED TO PRACTICE SOCIAL DISTANCING DUE TO THE COVID-19 PANDEMIC.

## 2019-11-01 LAB — CBC WITH DIFFERENTIAL/PLATELET
Basophils Absolute: 0.1 10*3/uL (ref 0.0–0.2)
Basos: 1 %
EOS (ABSOLUTE): 0.3 10*3/uL (ref 0.0–0.4)
Eos: 4 %
Hematocrit: 50.4 % (ref 37.5–51.0)
Hemoglobin: 16.8 g/dL (ref 13.0–17.7)
Immature Grans (Abs): 0 10*3/uL (ref 0.0–0.1)
Immature Granulocytes: 0 %
Lymphocytes Absolute: 2.8 10*3/uL (ref 0.7–3.1)
Lymphs: 34 %
MCH: 26.7 pg (ref 26.6–33.0)
MCHC: 33.3 g/dL (ref 31.5–35.7)
MCV: 80 fL (ref 79–97)
Monocytes Absolute: 0.6 10*3/uL (ref 0.1–0.9)
Monocytes: 8 %
Neutrophils Absolute: 4.5 10*3/uL (ref 1.4–7.0)
Neutrophils: 53 %
Platelets: 234 10*3/uL (ref 150–450)
RBC: 6.29 x10E6/uL — ABNORMAL HIGH (ref 4.14–5.80)
RDW: 14.4 % (ref 11.6–15.4)
WBC: 8.4 10*3/uL (ref 3.4–10.8)

## 2019-11-01 LAB — CMP14+EGFR
ALT: 30 IU/L (ref 0–44)
AST: 23 IU/L (ref 0–40)
Albumin/Globulin Ratio: 1.3 (ref 1.2–2.2)
Albumin: 4.3 g/dL (ref 4.0–5.0)
Alkaline Phosphatase: 89 IU/L (ref 39–117)
BUN/Creatinine Ratio: 8 — ABNORMAL LOW (ref 9–20)
BUN: 10 mg/dL (ref 6–20)
Bilirubin Total: 0.2 mg/dL (ref 0.0–1.2)
CO2: 23 mmol/L (ref 20–29)
Calcium: 9.1 mg/dL (ref 8.7–10.2)
Chloride: 101 mmol/L (ref 96–106)
Creatinine, Ser: 1.26 mg/dL (ref 0.76–1.27)
GFR calc Af Amer: 85 mL/min/{1.73_m2} (ref >59)
GFR calc non Af Amer: 73 mL/min/{1.73_m2} (ref >59)
Globulin, Total: 3.2 g/dL (ref 1.5–4.5)
Glucose: 83 mg/dL (ref 65–99)
Potassium: 4.3 mmol/L (ref 3.5–5.2)
Sodium: 137 mmol/L (ref 134–144)
Total Protein: 7.5 g/dL (ref 6.0–8.5)

## 2019-11-03 DIAGNOSIS — K625 Hemorrhage of anus and rectum: Secondary | ICD-10-CM | POA: Diagnosis not present

## 2019-11-03 DIAGNOSIS — K601 Chronic anal fissure: Secondary | ICD-10-CM | POA: Diagnosis not present

## 2019-11-03 DIAGNOSIS — K219 Gastro-esophageal reflux disease without esophagitis: Secondary | ICD-10-CM | POA: Diagnosis not present

## 2019-11-09 ENCOUNTER — Telehealth: Payer: Self-pay

## 2019-11-09 NOTE — Telephone Encounter (Signed)
Voicemail not set up unable to leave message. 1st attempt to give lab results

## 2019-11-09 NOTE — Telephone Encounter (Signed)
-----   Message from Arnette Felts, FNP sent at 11/08/2019  5:31 PM EST ----- Your labs are  normal.

## 2019-11-17 DIAGNOSIS — K219 Gastro-esophageal reflux disease without esophagitis: Secondary | ICD-10-CM | POA: Diagnosis not present

## 2019-11-24 ENCOUNTER — Encounter: Payer: BC Managed Care – PPO | Admitting: Internal Medicine

## 2019-12-07 DIAGNOSIS — I10 Essential (primary) hypertension: Secondary | ICD-10-CM | POA: Diagnosis not present

## 2019-12-07 DIAGNOSIS — K649 Unspecified hemorrhoids: Secondary | ICD-10-CM | POA: Diagnosis not present

## 2019-12-13 DIAGNOSIS — K6289 Other specified diseases of anus and rectum: Secondary | ICD-10-CM | POA: Diagnosis not present

## 2019-12-13 DIAGNOSIS — K219 Gastro-esophageal reflux disease without esophagitis: Secondary | ICD-10-CM | POA: Diagnosis not present

## 2019-12-19 ENCOUNTER — Other Ambulatory Visit: Payer: Self-pay

## 2019-12-19 ENCOUNTER — Emergency Department (HOSPITAL_COMMUNITY)
Admission: EM | Admit: 2019-12-19 | Discharge: 2019-12-19 | Disposition: A | Discharge status: Home / Self Care | Payer: BC Managed Care – PPO | Attending: Emergency Medicine | Admitting: Emergency Medicine

## 2019-12-19 ENCOUNTER — Encounter (HOSPITAL_COMMUNITY): Payer: Self-pay

## 2019-12-19 DIAGNOSIS — Z79899 Other long term (current) drug therapy: Secondary | ICD-10-CM | POA: Insufficient documentation

## 2019-12-19 DIAGNOSIS — K602 Anal fissure, unspecified: Secondary | ICD-10-CM | POA: Diagnosis not present

## 2019-12-19 DIAGNOSIS — J45909 Unspecified asthma, uncomplicated: Secondary | ICD-10-CM | POA: Insufficient documentation

## 2019-12-19 DIAGNOSIS — K6289 Other specified diseases of anus and rectum: Secondary | ICD-10-CM

## 2019-12-19 DIAGNOSIS — R030 Elevated blood-pressure reading, without diagnosis of hypertension: Secondary | ICD-10-CM

## 2019-12-19 DIAGNOSIS — K644 Residual hemorrhoidal skin tags: Secondary | ICD-10-CM | POA: Diagnosis not present

## 2019-12-19 MED ORDER — HYDROCORTISONE (PERIANAL) 2.5 % EX CREA: 1.0000 "application " | TOPICAL_CREAM | Freq: Three times a day (TID) | CUTANEOUS | 0 refills | Status: AC | PRN

## 2019-12-19 NOTE — ED Triage Notes (Signed)
Patient stattes he had hemorrhoids and was told 2 months ago that he had infection of the rectum. Patient c/o rectal pain.

## 2019-12-19 NOTE — ED Notes (Signed)
An After Visit Summary was printed and given to the patient. Discharge instructions given and no further questions at this time. Prescription reviewed with patient.

## 2019-12-19 NOTE — Discharge Instructions (Addendum)
It was our pleasure to provide your ER care today - we hope that you feel better.  Use anusol cream for symptom relief.  If constipation, drink plenty of water, get adequate fiber in diet, and take stool softener (colace).   Follow up with primary care doctor in 1-2 weeks.  Also have your blood pressure rechecked then, as it is high today.  Return to ER if worse, new symptoms, increased swelling, spreading redness, fevers, severe pain, or other concern.

## 2019-12-19 NOTE — ED Provider Notes (Signed)
[facility]-EMERGENCY DEPT Provider Note   CSN: [id number] Arrival date & time: 12/19/19  1120     History Chief Complaint  Patient presents with  . Rectal Pain    David Larsen is a 34 y.o. male.  Patient c/o rectal area pain in past few days. Symptoms acute onset, w sharp pain to anus, moderate, episodic, persistent. Worse if/when wipes or has bm.  Patient previously had noted streak of blood on toilet paper. No abd pain. No nausea or vomiting. Is having regular bms. No rectal injury/trauma. ?hx hemorrhoids. No fever/chills.   The history is provided by the patient.       Past Medical History:  Diagnosis Date  . Asthma     There are no problems to display for this patient.   History reviewed. No pertinent surgical history.     Family History  Problem Relation Age of Onset  . Hypertension Father     Social History   Tobacco Use  . Smoking status: Never Smoker  . Smokeless tobacco: Never Used  Substance Use Topics  . Alcohol use: No  . Drug use: Never    Home Medications Prior to Admission medications   Medication Sig Start Date End Date Taking? Authorizing Provider  albuterol (VENTOLIN HFA) 108 (90 Base) MCG/ACT inhaler Inhale 2 puffs into the lungs every 6 (six) hours as needed for wheezing or shortness of breath. 05/26/19   Rodriguez-Southworth, Nettie Elm, PA-C  benzonatate (TESSALON) 100 MG capsule Take 1 capsule (100 mg total) by mouth every 8 (eight) hours. Patient not taking: Reported on 05/26/2019 06/26/18   Janne Napoleon, NP  budesonide-formoterol [facility]) 80-4.5 MCG/ACT inhaler Inhale 2 puffs into the lungs daily. 05/26/19   Rodriguez-Southworth, Nettie Elm, PA-C  famotidine (PEPCID) 20 MG tablet Take 1 tablet (20 mg total) by mouth 2 (two) times daily. 12/12/16   Janne Napoleon, NP  hydrocortisone (ANUSOL-HC) 25 MG suppository Place 1 suppository (25 mg total) rectally 2 (two) times daily. 10/31/19   Arnette Felts, FNP     Allergies    Patient has no known allergies.  Review of Systems   Review of Systems  Constitutional: Negative for chills and fever.  Gastrointestinal: Positive for rectal pain. Negative for abdominal pain, constipation and nausea.  Genitourinary: Negative for dysuria.  Skin: Negative for rash.    Physical Exam Updated Vital Signs BP (!) 144/92 (BP Location: Right Arm)   Pulse 86   Temp 98.4 F (36.9 C) (Oral)   Resp 16   Ht 1.829 m (6')   Wt 132.9 kg   SpO2 98%   BMI 39.74 kg/m   Physical Exam Vitals and nursing note reviewed.  Constitutional:      Appearance: Normal appearance. He is well-developed.  HENT:     Head: Atraumatic.     Nose: Nose normal.     Mouth/Throat:     Mouth: Mucous membranes are moist.  Eyes:     General: No scleral icterus.    Conjunctiva/sclera: Conjunctivae normal.  Neck:     Trachea: No tracheal deviation.  Cardiovascular:     Rate and Rhythm: Normal rate.     Pulses: Normal pulses.  Pulmonary:     Effort: Pulmonary effort is normal. No accessory muscle usage or respiratory distress.  Abdominal:     General: There is no distension.     Palpations: Abdomen is soft.     Tenderness: There is no abdominal tenderness. There is no guarding.  Genitourinary:  Comments: Small external hemorrhoid, mildly tender, not acutely thrombosed. Tiny, superficial fissure. No bleeding. No perirectal abscess or area of fluctuance. No skin changes, erythema or cellulitis.  Musculoskeletal:        General: No swelling.     Cervical back: Normal range of motion and neck supple. No rigidity.  Skin:    General: Skin is warm and dry.     Findings: No rash.  Neurological:     Mental Status: He is alert.     Comments: Alert, speech clear.   Psychiatric:        Mood and Affect: Mood normal.     ED Results / Procedures / Treatments   Labs (all labs ordered are listed, but only abnormal results are displayed) Labs Reviewed - No data to  display  EKG None  Radiology No results found.  Procedures Procedures (including critical care time)  Medications Ordered in ED Medications - No data to display  ED Course  I have reviewed the triage vital signs and the nursing notes.  Pertinent labs & imaging results that were available during my care of the patient were reviewed by me and considered in my medical decision making (see chart for details).    MDM Rules/Calculators/A&P                      Reviewed nursing notes and prior charts for additional history.   Discussed diff dx.   RX anusol cream.  Rec pcp f/u (pt indicates pcp is Dr Jonelle Sidle).  Return precautions provided.     Final Clinical Impression(s) / ED Diagnoses Final diagnoses:  None    Rx / DC Orders ED Discharge Orders    None       Lajean Saver, MD 12/19/19 1217

## 2019-12-22 ENCOUNTER — Ambulatory Visit: Payer: BC Managed Care – PPO | Admitting: Internal Medicine

## 2019-12-28 ENCOUNTER — Emergency Department (HOSPITAL_COMMUNITY)
Admission: EM | Admit: 2019-12-28 | Discharge: 2019-12-29 | Disposition: A | Discharge status: Home / Self Care | Payer: BC Managed Care – PPO | Attending: Emergency Medicine | Admitting: Emergency Medicine

## 2019-12-28 ENCOUNTER — Other Ambulatory Visit: Payer: Self-pay

## 2019-12-28 DIAGNOSIS — J45909 Unspecified asthma, uncomplicated: Secondary | ICD-10-CM | POA: Insufficient documentation

## 2019-12-28 DIAGNOSIS — Z79899 Other long term (current) drug therapy: Secondary | ICD-10-CM | POA: Insufficient documentation

## 2019-12-28 DIAGNOSIS — K6289 Other specified diseases of anus and rectum: Secondary | ICD-10-CM | POA: Insufficient documentation

## 2019-12-28 NOTE — ED Triage Notes (Signed)
Pt in POV, reports rectal pain X several weeks. Was told he he has "infection" in his rectum. Pt seen for same 12/19/19 and dx with hemorrhoids, not infection. Told to follow up with PCP but never did.

## 2019-12-29 ENCOUNTER — Other Ambulatory Visit: Payer: Self-pay | Admitting: Gastroenterology

## 2019-12-29 ENCOUNTER — Other Ambulatory Visit: Payer: Self-pay

## 2019-12-29 ENCOUNTER — Emergency Department (HOSPITAL_COMMUNITY): Payer: BC Managed Care – PPO

## 2019-12-29 DIAGNOSIS — K6289 Other specified diseases of anus and rectum: Secondary | ICD-10-CM

## 2019-12-29 LAB — CBC WITH DIFFERENTIAL/PLATELET
Abs Immature Granulocytes: 0.01 10*3/uL (ref 0.00–0.07)
Basophils Absolute: 0.1 10*3/uL (ref 0.0–0.1)
Basophils Relative: 1 %
Eosinophils Absolute: 0.4 10*3/uL (ref 0.0–0.5)
Eosinophils Relative: 6 %
HCT: 46 % (ref 39.0–52.0)
Hemoglobin: 15.2 g/dL (ref 13.0–17.0)
Immature Granulocytes: 0 %
Lymphocytes Relative: 46 %
Lymphs Abs: 3.2 10*3/uL (ref 0.7–4.0)
MCH: 26.6 pg (ref 26.0–34.0)
MCHC: 33 g/dL (ref 30.0–36.0)
MCV: 80.4 fL (ref 80.0–100.0)
Monocytes Absolute: 0.5 10*3/uL (ref 0.1–1.0)
Monocytes Relative: 7 %
Neutro Abs: 2.7 10*3/uL (ref 1.7–7.7)
Neutrophils Relative %: 40 %
Platelets: 198 10*3/uL (ref 150–400)
RBC: 5.72 MIL/uL (ref 4.22–5.81)
RDW: 13.5 % (ref 11.5–15.5)
WBC: 6.8 10*3/uL (ref 4.0–10.5)
nRBC: 0 % (ref 0.0–0.2)

## 2019-12-29 LAB — BASIC METABOLIC PANEL
Anion gap: 10 (ref 5–15)
BUN: 9 mg/dL (ref 6–20)
CO2: 24 mmol/L (ref 22–32)
Calcium: 9 mg/dL (ref 8.9–10.3)
Chloride: 103 mmol/L (ref 98–111)
Creatinine, Ser: 0.83 mg/dL (ref 0.61–1.24)
GFR calc Af Amer: >60 mL/min (ref >60)
GFR calc non Af Amer: >60 mL/min (ref >60)
Glucose, Bld: 87 mg/dL (ref 70–99)
Potassium: 4 mmol/L (ref 3.5–5.1)
Sodium: 137 mmol/L (ref 135–145)

## 2019-12-29 MED ORDER — HYDROCODONE-ACETAMINOPHEN 5-325 MG PO TABS: 1.0000 | ORAL_TABLET | Freq: Four times a day (QID) | ORAL | 0 refills | Status: DC | PRN

## 2019-12-29 MED ORDER — METRONIDAZOLE 500 MG PO TABS: 500.0000 mg | ORAL_TABLET | Freq: Two times a day (BID) | ORAL | 0 refills | Status: DC

## 2019-12-29 MED ORDER — CIPROFLOXACIN HCL 500 MG PO TABS: 500.0000 mg | ORAL_TABLET | Freq: Two times a day (BID) | ORAL | 0 refills | Status: DC

## 2019-12-29 MED ORDER — IOHEXOL 300 MG/ML  SOLN
125.0000 mL | Freq: Once | INTRAMUSCULAR | Status: AC | PRN
  Administered 2019-12-29: 125 mL via INTRAVENOUS

## 2019-12-29 MED ORDER — FENTANYL CITRATE (PF) 100 MCG/2ML IJ SOLN
100.0000 ug | Freq: Once | INTRAMUSCULAR | Status: AC
  Administered 2019-12-29: 03:00:00 100 ug via INTRAVENOUS
  Filled 2019-12-29: qty 2

## 2019-12-29 NOTE — ED Provider Notes (Signed)
[facility] EMERGENCY DEPARTMENT Provider Note   CSN: [id number] Arrival date & time: 12/28/19  1750     History Chief Complaint  Patient presents with  . Hemorrhoids    David Larsen is a 34 y.o. male.  The history is provided by the patient.   Patient presents for rectal pain.  He reports has had rectal pain for several weeks.  Reports it hurts to have a bowel movement and sit down.  No fevers or vomiting.  No abdominal pain.  His course is worsening Nothing improves his symptoms He is aware that he has a hemorrhoid but this feels different and may be an infection    Past Medical History:  Diagnosis Date  . Asthma     There are no problems to display for this patient.   No past surgical history on file.     Family History  Problem Relation Age of Onset  . Hypertension Father     Social History   Tobacco Use  . Smoking status: Never Smoker  . Smokeless tobacco: Never Used  Substance Use Topics  . Alcohol use: No  . Drug use: Never    Home Medications Prior to Admission medications   Medication Sig Start Date End Date Taking? Authorizing Provider  albuterol (VENTOLIN HFA) 108 (90 Base) MCG/ACT inhaler Inhale 2 puffs into the lungs every 6 (six) hours as needed for wheezing or shortness of breath. 05/26/19   Rodriguez-Southworth, Nettie Elm, PA-C  benzonatate (TESSALON) 100 MG capsule Take 1 capsule (100 mg total) by mouth every 8 (eight) hours. Patient not taking: Reported on 05/26/2019 06/26/18   Janne Napoleon, NP  budesonide-formoterol [facility]) 80-4.5 MCG/ACT inhaler Inhale 2 puffs into the lungs daily. 05/26/19   Rodriguez-Southworth, Nettie Elm, PA-C  famotidine (PEPCID) 20 MG tablet Take 1 tablet (20 mg total) by mouth 2 (two) times daily. 12/12/16   Janne Napoleon, NP  hydrocortisone (ANUSOL-HC) 2.5 % rectal cream Place 1 application rectally 3 (three) times daily as needed for hemorrhoids or anal itching. 12/19/19   Cathren Laine, MD    hydrocortisone (ANUSOL-HC) 25 MG suppository Place 1 suppository (25 mg total) rectally 2 (two) times daily. 10/31/19   Arnette Felts, FNP    Allergies    Patient has no known allergies.  Review of Systems   Review of Systems  Constitutional: Negative for fever.  Gastrointestinal: Positive for rectal pain. Negative for vomiting.  All other systems reviewed and are negative.   Physical Exam Updated Vital Signs BP 112/63 (BP Location: Left Arm)   Pulse 65   Temp 97.7 F (36.5 C) (Oral)   Resp (!) 22   Ht 1.829 m (6')   Wt 113.4 kg   SpO2 100%   BMI 33.91 kg/m   Physical Exam CONSTITUTIONAL: Well developed/well nourished HEAD: Normocephalic/atraumatic EYES: EOMI ENMT: Mucous membranes moist NECK: supple no meningeal signs CV: S1/S2 noted, no murmurs/rubs/gallops noted LUNGS: Lungs are clear to auscultation bilaterally, no apparent distress ABDOMEN: soft, nontender, no rebound or guarding, bowel sounds noted throughout abdomen GU:no cva tenderness Rectal-nonthrombosed external hemorrhoid is noted, the patient has diffuse perirectal tenderness.  Exam is difficult and I am unable to determine if he has any induration.  No drainage.  No blood or melena noted.  No rectal mass noted.  No perineal tenderness.  Nurse chaperone present for exam NEURO: Pt is awake/alert/appropriate, moves all extremitiesx4.  No facial droop.   EXTREMITIES: pulses normal/equal, full ROM SKIN: warm, color normal PSYCH:  no abnormalities of mood noted, alert and oriented to situation  ED Results / Procedures / Treatments   Labs (all labs ordered are listed, but only abnormal results are displayed) Labs Reviewed  BASIC METABOLIC PANEL  CBC WITH DIFFERENTIAL/PLATELET    EKG None  Radiology CT PELVIS W CONTRAST  Result Date: 12/29/2019 CLINICAL DATA:  Rectal pain for several weeks EXAM: CT PELVIS WITH CONTRAST TECHNIQUE: Multidetector CT imaging of the pelvis was performed using the standard  protocol following the bolus administration of intravenous contrast. CONTRAST:  132mL OMNIPAQUE IOHEXOL 300 MG/ML  SOLN COMPARISON:  None. FINDINGS: Urinary Tract: The visualized distal ureters and bladder appear unremarkable. Bowel: No bowel wall thickening, distention or surrounding inflammation identified within the pelvis. Vascular/Lymphatic: No enlarged pelvic lymph nodes identified. No significant vascular findings. Reproductive: The prostate is unremarkable. Other: Within the perineal soft tissues there appears to be skin thickening seen. There is mild wall thickening seen around the rectal wall. However no definite loculated fluid collections are seen within the subcutaneous soft tissues. No subcutaneous emphysema. Musculoskeletal: No acute or significant osseous findings. IMPRESSION: skin thickening seen around the perineal soft tissues. However no abscess or subcutaneous emphysema. Findings suggestive of proctitis. Electronically Signed   By: Prudencio Pair M.D.   On: 12/29/2019 05:24    Procedures Procedures  Medications Ordered in ED Medications  fentaNYL (SUBLIMAZE) injection 100 mcg (100 mcg Intravenous Given 12/29/19 0250)  iohexol (OMNIPAQUE) 300 MG/ML solution 125 mL (125 mLs Intravenous Contrast Given 12/29/19 0504)    ED Course  I have reviewed the triage vital signs and the nursing notes.  Pertinent labs  results that were available during my care of the patient were reviewed by me and considered in my medical decision making (see chart for details).    MDM Rules/Calculators/A&P                      2:40 AM Patient presents with continued rectal pain for weeks.  He has a small external hemorrhoid, but he has diffuse tenderness in the perirectal region.  I am unable to rule out an infectious etiology.  He has had previous ER visits for similar pain.  We will proceed with CT imaging 5:52 AM Patient is stable in the ER.  CT findings consistent with possible proctitis.  No signs of  any drainable abscess.  Patient denies any history of inflammatory bowel disease.  In terms of sexual history, patient is married and has not had intercourse in over 3 months as his wife is still in [place].  He has no history of anal intercourse.  Unclear cause of proctitis at this time.  We will start on oral antibiotics and refer back to PCP in 1 week for recheck. Final Clinical Impression(s) / ED Diagnoses Final diagnoses:  Proctitis    Rx / DC Orders ED Discharge Orders         Ordered    ciprofloxacin (CIPRO) 500 MG tablet  2 times daily     12/29/19 0549    metroNIDAZOLE (FLAGYL) 500 MG tablet  2 times daily     12/29/19 0549    HYDROcodone-acetaminophen (NORCO/VICODIN) 5-325 MG tablet  Every 6 hours PRN     12/29/19 0549           Ripley Fraise, MD 12/29/19 [phone]

## 2019-12-29 NOTE — Discharge Instructions (Addendum)
Please see Dr. Mikeal Hawthorne in 7-10 days Take antibiotics as we discussed for 7 days. If you have any fever, vomiting or worsening pain please return to the ER.

## 2020-01-02 ENCOUNTER — Other Ambulatory Visit: Payer: Self-pay

## 2020-01-02 DIAGNOSIS — K644 Residual hemorrhoidal skin tags: Secondary | ICD-10-CM | POA: Insufficient documentation

## 2020-01-02 DIAGNOSIS — Z79899 Other long term (current) drug therapy: Secondary | ICD-10-CM | POA: Insufficient documentation

## 2020-01-02 DIAGNOSIS — K649 Unspecified hemorrhoids: Secondary | ICD-10-CM | POA: Diagnosis not present

## 2020-01-02 DIAGNOSIS — J45909 Unspecified asthma, uncomplicated: Secondary | ICD-10-CM | POA: Insufficient documentation

## 2020-01-03 ENCOUNTER — Emergency Department (HOSPITAL_COMMUNITY)
Admission: EM | Admit: 2020-01-03 | Discharge: 2020-01-03 | Disposition: A | Discharge status: Home / Self Care | Payer: BC Managed Care – PPO | Attending: Emergency Medicine | Admitting: Emergency Medicine

## 2020-01-03 ENCOUNTER — Encounter (HOSPITAL_COMMUNITY): Payer: Self-pay

## 2020-01-03 DIAGNOSIS — K644 Residual hemorrhoidal skin tags: Secondary | ICD-10-CM

## 2020-01-03 LAB — COMPREHENSIVE METABOLIC PANEL
ALT: 48 U/L — ABNORMAL HIGH (ref 0–44)
AST: 40 U/L (ref 15–41)
Albumin: 4.4 g/dL (ref 3.5–5.0)
Alkaline Phosphatase: 56 U/L (ref 38–126)
Anion gap: 7 (ref 5–15)
BUN: 11 mg/dL (ref 6–20)
CO2: 27 mmol/L (ref 22–32)
Calcium: 9.3 mg/dL (ref 8.9–10.3)
Chloride: 103 mmol/L (ref 98–111)
Creatinine, Ser: 0.94 mg/dL (ref 0.61–1.24)
GFR calc Af Amer: >60 mL/min (ref >60)
GFR calc non Af Amer: >60 mL/min (ref >60)
Glucose, Bld: 98 mg/dL (ref 70–99)
Potassium: 4.1 mmol/L (ref 3.5–5.1)
Sodium: 137 mmol/L (ref 135–145)
Total Bilirubin: 0.8 mg/dL (ref 0.3–1.2)
Total Protein: 8.1 g/dL (ref 6.5–8.1)

## 2020-01-03 LAB — CBC
HCT: 47.5 % (ref 39.0–52.0)
Hemoglobin: 16.1 g/dL (ref 13.0–17.0)
MCH: 27.4 pg (ref 26.0–34.0)
MCHC: 33.9 g/dL (ref 30.0–36.0)
MCV: 80.9 fL (ref 80.0–100.0)
Platelets: 214 10*3/uL (ref 150–400)
RBC: 5.87 MIL/uL — ABNORMAL HIGH (ref 4.22–5.81)
RDW: 13.4 % (ref 11.5–15.5)
WBC: 6.5 10*3/uL (ref 4.0–10.5)
nRBC: 0 % (ref 0.0–0.2)

## 2020-01-03 LAB — TYPE AND SCREEN
ABO/RH(D): O NEG
Antibody Screen: NEGATIVE

## 2020-01-03 LAB — ABO/RH: ABO/RH(D): O NEG

## 2020-01-03 MED ORDER — HYDROCORTISONE ACETATE 25 MG RE SUPP: 25.0000 mg | Freq: Two times a day (BID) | RECTAL | 0 refills | Status: AC

## 2020-01-03 MED ORDER — DOCUSATE SODIUM 100 MG PO CAPS: 100.0000 mg | ORAL_CAPSULE | Freq: Two times a day (BID) | ORAL | 0 refills | Status: AC

## 2020-01-03 NOTE — ED Triage Notes (Signed)
Arrived POV from home patient reports he has hemorrhoids that have been bleeding all day today. Patient states he was diagnosed with an infection of hemorrhoids and is taking medication for it.

## 2020-01-03 NOTE — ED Provider Notes (Signed)
[facility] Provider Note   CSN: [id number] Arrival date & time: 01/02/20  2243     History Chief Complaint  Patient presents with  . Hemorrhoids    David Larsen is a 34 y.o. male.  The history is provided by the patient and medical records.    34 year old male with history of asthma, presenting to the ED with hemorrhoidal pain.  Patient has known hemorrhoids and was seen in the ER recently and diagnosed with proctitis.  He is currently on antibiotics for this.  States today he had a lot of rectal bleeding, all associated with bowel movement.  States when not using the bathroom he does not have any bleeding.  Blood has been bright red in color.  States his stools have been somewhat hard and difficult to pass causing him to strain.  He does have increased pain with this.  He has not had any fevers or chills.  He denies any urinary symptoms.  Past Medical History:  Diagnosis Date  . Asthma     There are no problems to display for this patient.   No past surgical history on file.     Family History  Problem Relation Age of Onset  . Hypertension Father     Social History   Tobacco Use  . Smoking status: Never Smoker  . Smokeless tobacco: Never Used  Substance Use Topics  . Alcohol use: No  . Drug use: Never    Home Medications Prior to Admission medications   Medication Sig Start Date End Date Taking? Authorizing Provider  albuterol (VENTOLIN HFA) 108 (90 Base) MCG/ACT inhaler Inhale 2 puffs into the lungs every 6 (six) hours as needed for wheezing or shortness of breath. 05/26/19   Rodriguez-Southworth, Nettie Elm, PA-C  budesonide-formoterol (SYMBICORT) 80-4.5 MCG/ACT inhaler Inhale 2 puffs into the lungs daily. 05/26/19   Rodriguez-Southworth, Nettie Elm, PA-C  ciprofloxacin (CIPRO) 500 MG tablet Take 1 tablet (500 mg total) by mouth 2 (two) times daily. One po bid x 7 days 12/29/19   Zadie Rhine, MD  famotidine (PEPCID) 20 MG  tablet Take 1 tablet (20 mg total) by mouth 2 (two) times daily. 12/12/16   Janne Napoleon, NP  HYDROcodone-acetaminophen (NORCO/VICODIN) 5-325 MG tablet Take 1 tablet by mouth every 6 (six) hours as needed. 12/29/19   Zadie Rhine, MD  hydrocortisone (ANUSOL-HC) 2.5 % rectal cream Place 1 application rectally 3 (three) times daily as needed for hemorrhoids or anal itching. 12/19/19   Cathren Laine, MD  hydrocortisone (ANUSOL-HC) 25 MG suppository Place 1 suppository (25 mg total) rectally 2 (two) times daily. 10/31/19   Arnette Felts, FNP  metroNIDAZOLE (FLAGYL) 500 MG tablet Take 1 tablet (500 mg total) by mouth 2 (two) times daily. One po bid x 7 days 12/29/19   Zadie Rhine, MD    Allergies    Patient has no known allergies.  Review of Systems   Review of Systems  Gastrointestinal: Positive for anal bleeding and rectal pain.  All other systems reviewed and are negative.   Physical Exam Updated Vital Signs BP (!) 144/104 (BP Location: Left Arm)   Pulse 75   Temp 98 F (36.7 C) (Oral)   Resp 18   Ht 6\' 1"  (1.854 m)   Wt 113.4 kg   SpO2 100%   BMI 32.98 kg/m   Physical Exam Vitals and nursing note reviewed.  Constitutional:      Appearance: He is well-developed.  HENT:     Head:  Normocephalic and atraumatic.  Eyes:     Conjunctiva/sclera: Conjunctivae normal.     Pupils: Pupils are equal, round, and reactive to light.  Cardiovascular:     Rate and Rhythm: Normal rate and regular rhythm.     Heart sounds: Normal heart sounds.  Pulmonary:     Effort: Pulmonary effort is normal.     Breath sounds: Normal breath sounds.  Abdominal:     General: Bowel sounds are normal.     Palpations: Abdomen is soft.  Genitourinary:    Comments: Non-thrombosed, non-bleeding external hemorrhoid present, there is no blood or purulent drainage present, no swelling or appreciable abscess Musculoskeletal:        General: Normal range of motion.     Cervical back: Normal range of motion.    Skin:    General: Skin is warm and dry.  Neurological:     Mental Status: He is alert and oriented to person, place, and time.     ED Results / Procedures / Treatments   Labs (all labs ordered are listed, but only abnormal results are displayed) Labs Reviewed  COMPREHENSIVE METABOLIC PANEL - Abnormal; Notable for the following components:      Result Value   ALT 48 (*)    All other components within normal limits  CBC - Abnormal; Notable for the following components:   RBC 5.87 (*)    All other components within normal limits  POC OCCULT BLOOD, ED  TYPE AND SCREEN  ABO/RH    EKG None  Radiology No results found.  Procedures Procedures (including critical care time)  Medications Ordered in ED Medications - No data to display  ED Course  I have reviewed the triage vital signs and the nursing notes.  Pertinent labs & imaging results that were available during my care of the patient were reviewed by me and considered in my medical decision making (see chart for details).    MDM Rules/Calculators/A&P  34 year old male presenting to the ED with hemorrhoids.  He has history of same and was recently diagnosed with proctitis.  He is currently taking antibiotics.  He is also been taking hydrocodone for pain which seems to have hard in his stools and caused a mild constipation.  States today he was straining to have bowel movements and had increased rectal pain and a lot of rectal bleeding after stool.  He denies any bleeding when not having bowel movement.  He is afebrile and nontoxic.  His abdomen is soft and benign.  Vitals are stable.  Rectal exam was performed, he does have nonthrombosed, nonbleeding external hemorrhoid but there is no appreciable bleeding or purulent drainage.  No noted abscess formation.  His labs today are reassuring with stable hemoglobin.  I discussed with him adding Anusol suppositories as well as Colace to help soften stools and ease bowel movements.   Also encouraged to eat high-fiber diet.  Recommended close follow-up with PCP.  He was also given follow-up with GI as source of his proctitis is unclear at this time.  He will return here for any new or acute changes.  Final Clinical Impression(s) / ED Diagnoses Final diagnoses:  External hemorrhoids    Rx / DC Orders ED Discharge Orders         Ordered    hydrocortisone (ANUSOL-HC) 25 MG suppository  2 times daily     01/03/20 0329    docusate sodium (COLACE) 100 MG capsule  Every 12 hours     01/03/20 0329  Larene Pickett, PA-C 01/03/20 7209    Rolland Porter, MD 01/03/20 [phone]

## 2020-01-03 NOTE — Discharge Instructions (Signed)
Take the prescribed medication as directed.  Make sure to eat high fiber diet to keeps stools soft, colace will also help with this. Make sure to finish your antibiotics. Follow-up with your primary care doctor.   Can also follow-up with GI if any ongoing issues-- can call for appt or have your primary care doctor refer you. Return to the ED for new or worsening symptoms.

## 2020-01-05 DIAGNOSIS — K649 Unspecified hemorrhoids: Secondary | ICD-10-CM | POA: Diagnosis not present

## 2020-01-05 DIAGNOSIS — I10 Essential (primary) hypertension: Secondary | ICD-10-CM | POA: Diagnosis not present

## 2020-01-23 ENCOUNTER — Other Ambulatory Visit: Payer: Self-pay

## 2020-01-23 ENCOUNTER — Encounter (HOSPITAL_COMMUNITY): Payer: Self-pay | Admitting: Student

## 2020-01-23 ENCOUNTER — Emergency Department (HOSPITAL_COMMUNITY)
Admission: EM | Admit: 2020-01-23 | Discharge: 2020-01-23 | Disposition: A | Discharge status: Home / Self Care | Payer: BC Managed Care – PPO | Attending: Emergency Medicine | Admitting: Emergency Medicine

## 2020-01-23 DIAGNOSIS — K6289 Other specified diseases of anus and rectum: Secondary | ICD-10-CM | POA: Insufficient documentation

## 2020-01-23 DIAGNOSIS — J45909 Unspecified asthma, uncomplicated: Secondary | ICD-10-CM | POA: Diagnosis not present

## 2020-01-23 DIAGNOSIS — Z79899 Other long term (current) drug therapy: Secondary | ICD-10-CM | POA: Insufficient documentation

## 2020-01-23 MED ORDER — LIDOCAINE (ANORECTAL) 5 % EX GEL: CUTANEOUS | 0 refills | Status: AC

## 2020-01-23 MED ORDER — AMOXICILLIN-POT CLAVULANATE 875-125 MG PO TABS: 1.0000 | ORAL_TABLET | Freq: Two times a day (BID) | ORAL | 0 refills | Status: AC

## 2020-01-23 NOTE — ED Provider Notes (Signed)
[facility] EMERGENCY DEPARTMENT Provider Note   CSN: [id number] Arrival date & time: 01/23/20  0024     History Chief Complaint  Patient presents with  . Rectal Pain    David Larsen is a 33 y.o. male with a history of asthma who presents to the ED with complaints of continued rectal pain. Patient states pain is intermittent, worse with BMs, no current alleviating factors. Has longstanding issue with hemorrhoids, but was seen in the ED 12/28/19 and found to have findings suggestive of proctitis on CT imaging- confirmed with chart review for additional history. He was treated with cipro/flagyl and states he had significant improvement in pain, but since completion of abx pain has come back. He is currently utilizing anusol. Denies fever, chills, abdominal pain, N/V, anal drainage, or constipation. He states he is not currently sexually active as his wife is in [place] for 3 months.   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   HPI     Past Medical History:  Diagnosis Date  . Asthma     There are no problems to display for this patient.   No past surgical history on file.     Family History  Problem Relation Age of Onset  . Hypertension Father     Social History   Tobacco Use  . Smoking status: Never Smoker  . Smokeless tobacco: Never Used  Substance Use Topics  . Alcohol use: No  . Drug use: Never    Home Medications Prior to Admission medications   Medication Sig Start Date End Date Taking? Authorizing Provider  albuterol (VENTOLIN HFA) 108 (90 Base) MCG/ACT inhaler Inhale 2 puffs into the lungs every 6 (six) hours as needed for wheezing or shortness of breath. Patient not taking: Reported on 01/03/2020 05/26/19   Rodriguez-Southworth, Sunday Spillers, PA-C  budesonide-formoterol [facility]) 80-4.5 MCG/ACT inhaler Inhale 2 puffs into the lungs daily. Patient not taking: Reported on 01/03/2020 05/26/19   Rodriguez-Southworth, Sunday Spillers, PA-C    ciprofloxacin (CIPRO) 500 MG tablet Take 1 tablet (500 mg total) by mouth 2 (two) times daily. One po bid x 7 days 12/29/19   Ripley Fraise, MD  docusate sodium (COLACE) 100 MG capsule Take 1 capsule (100 mg total) by mouth every 12 (twelve) hours. 01/03/20   Larene Pickett, PA-C  famotidine (PEPCID) 20 MG tablet Take 1 tablet (20 mg total) by mouth 2 (two) times daily. Patient not taking: Reported on 01/03/2020 12/12/16   Ashley Murrain, NP  HYDROcodone-acetaminophen (NORCO/VICODIN) 5-325 MG tablet Take 1 tablet by mouth every 6 (six) hours as needed. Patient not taking: Reported on 01/03/2020 12/29/19   Ripley Fraise, MD  hydrocortisone (ANUSOL-HC) 2.5 % rectal cream Place 1 application rectally 3 (three) times daily as needed for hemorrhoids or anal itching. 12/19/19   Lajean Saver, MD  hydrocortisone (ANUSOL-HC) 25 MG suppository Place 1 suppository (25 mg total) rectally 2 (two) times daily. 01/03/20   Larene Pickett, PA-C  metroNIDAZOLE (FLAGYL) 500 MG tablet Take 1 tablet (500 mg total) by mouth 2 (two) times daily. One po bid x 7 days 12/29/19   Ripley Fraise, MD    Allergies    Patient has no known allergies.  Review of Systems   Review of Systems  Constitutional: Negative for chills and fever.  Respiratory: Negative for shortness of breath.   Cardiovascular: Negative for chest pain.  Gastrointestinal: Positive for rectal pain. Negative for abdominal pain, blood in stool, constipation, diarrhea, nausea and vomiting.  Genitourinary: Negative  for dysuria.  Neurological: Negative for syncope.  All other systems reviewed and are negative.   Physical Exam Updated Vital Signs BP 111/80   Pulse 71   Temp 98.5 F (36.9 C) (Oral)   Resp 18   Wt 111.1 kg   SpO2 100%   BMI 32.32 kg/m   Physical Exam Vitals and nursing note reviewed. Exam conducted with a chaperone present.  Constitutional:      General: He is not in acute distress.    Appearance: He is well-developed. He is  not toxic-appearing.  HENT:     Head: Normocephalic and atraumatic.  Eyes:     General:        Right eye: No discharge.        Left eye: No discharge.     Conjunctiva/sclera: Conjunctivae normal.  Cardiovascular:     Rate and Rhythm: Normal rate and regular rhythm.  Pulmonary:     Effort: Pulmonary effort is normal. No respiratory distress.     Breath sounds: Normal breath sounds. No wheezing, rhonchi or rales.  Abdominal:     General: There is no distension.     Palpations: Abdomen is soft.     Tenderness: There is no abdominal tenderness.  Genitourinary:      Comments: Perianal area is generally tender to palpation. No overlying erythema/warmth. No obvious induration/fluctuance. DRE uncomfortable for patient but no focal fluctuance/tenderness. Soft brown stool noted.  Musculoskeletal:     Cervical back: Neck supple.  Skin:    General: Skin is warm and dry.     Findings: No rash.  Neurological:     Mental Status: He is alert.     Comments: Clear speech.   Psychiatric:        Behavior: Behavior normal.     ED Results / Procedures / Treatments   Labs (all labs ordered are listed, but only abnormal results are displayed) Labs Reviewed - No data to display  EKG None  Radiology No results found.  Procedures Procedures (including critical care time)  Medications Ordered in ED Medications - No data to display  ED Course  I have reviewed the triage vital signs and the nursing notes.  Pertinent labs & imaging results that were available during my care of the patient were reviewed by me and considered in my medical decision making (see chart for details).    MDM Rules/Calculators/A&P                     This patient presents to the ED for concern of rectal pain.   Additional history obtained:  Additional history obtained from chart review & nursing note review. Previous records obtained and reviewed including CT pelvis with contrast 12/29/19.  IMPRESSION: skin  thickening seen around the perineal soft tissues. However no abscess or subcutaneous emphysema.  Findings suggestive of proctitis.  On exam patient does have an external hemorrhoid present that is non-thrombosed. He has no erythema or palpable fluctuance, no fluctuance on DRE, low suspicion for perianal/rectal abscess. DRE with soft brown stool- no melena. He has generalized peri-anal tenderness to palpation. Patient states sxs similar to prior to tx for proctitis and did have improvement with abx. He adamantly denies being sexually active, specifically is not having anal intercourse. Discussed findings and plan of care with supervising physician Dr. Donnald Garre- recommends re-treat with augmentin for 10 days which I am in agreement with. Patient has GI follow up scheduled in June and appears to have a pelvic  MRI ordered for 05/06. Instructed him to follow up closely. Will also provide topical lidocaine for symptomatic relief. Patient is afebrile, does not appear septic or toxic, has improvement with prior abx therefore does not seem to be failed outpatient therapy  More of re-occurrence based on history, outpatient abx seems reasonable at this time. I discussed treatment plan, need for follow-up, and return precautions with the patient. Provided opportunity for questions, patient confirmed understanding and is in agreement with plan.   Portions of this note were generated with Scientist, clinical (histocompatibility and immunogenetics). Dictation errors may occur despite best attempts at proofreading.  Final Clinical Impression(s) / ED Diagnoses Final diagnoses:  Rectal pain    Rx / DC Orders ED Discharge Orders         Ordered    amoxicillin-clavulanate (AUGMENTIN) 875-125 MG tablet  Every 12 hours     01/23/20 0825    Lidocaine, Anorectal, 5 % GEL     01/23/20 0825           Minard Millirons, Pleas Koch, PA-C 01/23/20 6160    Arby Barrette, MD 01/23/20 [phone]

## 2020-01-23 NOTE — Discharge Instructions (Signed)
You were seen in the emergency department today for rectal pain.  You do have a hemorrhoid noted on exam.  We suspect your pain may be related to the hemorrhoid as well as to the reoccurrence of infection.  We are starting you on antibiotics to try to treat this infection.  Please take the Augmentin, antibiotic, as prescribed.  We are also sending you home with topical lidocaine to place around the rectum every 4-6 hours as needed for pain.  We have prescribed you new medication(s) today. Discuss the medications prescribed today with your pharmacist as they can have adverse effects and interactions with your other medicines including over the counter and prescribed medications. Seek medical evaluation if you start to experience new or abnormal symptoms after taking one of these medicines, seek care immediately if you start to experience difficulty breathing, feeling of your throat closing, facial swelling, or rash as these could be indications of a more serious allergic reaction  Please follow-up with gastroenterology within 1 week.  We have provided to local GI doctors.  Return to the ER for new or worsening symptoms including but not limited to increased pain, fever, rectal drainage, redness around the rectum, or any other concerns.

## 2020-01-23 NOTE — ED Triage Notes (Signed)
Per pt he has been having hemorrhoids flare up for about 2 month that progressively gotten worse. Pt said very painful. Hard to sit down.

## 2020-01-27 DIAGNOSIS — K6289 Other specified diseases of anus and rectum: Secondary | ICD-10-CM | POA: Diagnosis not present

## 2020-02-09 ENCOUNTER — Encounter (INDEPENDENT_AMBULATORY_CARE_PROVIDER_SITE_OTHER): Payer: Self-pay

## 2020-02-09 ENCOUNTER — Other Ambulatory Visit: Payer: Self-pay

## 2020-02-09 ENCOUNTER — Ambulatory Visit
Admission: RE | Admit: 2020-02-09 | Discharge: 2020-02-09 | Disposition: A | Discharge status: Home / Self Care | Payer: BC Managed Care – PPO | Source: Ambulatory Visit | Attending: Gastroenterology | Admitting: Gastroenterology

## 2020-02-09 DIAGNOSIS — K6289 Other specified diseases of anus and rectum: Secondary | ICD-10-CM

## 2020-02-09 MED ORDER — GADOBENATE DIMEGLUMINE 529 MG/ML IV SOLN
20.0000 mL | Freq: Once | INTRAVENOUS | Status: AC | PRN
  Administered 2020-02-09: 20 mL via INTRAVENOUS

## 2020-04-02 DIAGNOSIS — K625 Hemorrhage of anus and rectum: Secondary | ICD-10-CM | POA: Diagnosis not present

## 2020-04-02 DIAGNOSIS — K602 Anal fissure, unspecified: Secondary | ICD-10-CM | POA: Diagnosis not present

## 2020-04-02 DIAGNOSIS — R195 Other fecal abnormalities: Secondary | ICD-10-CM | POA: Diagnosis not present

## 2020-04-02 DIAGNOSIS — R935 Abnormal findings on diagnostic imaging of other abdominal regions, including retroperitoneum: Secondary | ICD-10-CM | POA: Diagnosis not present

## 2020-04-25 DIAGNOSIS — K640 First degree hemorrhoids: Secondary | ICD-10-CM | POA: Diagnosis not present

## 2020-04-25 DIAGNOSIS — R933 Abnormal findings on diagnostic imaging of other parts of digestive tract: Secondary | ICD-10-CM | POA: Diagnosis not present

## 2020-04-25 DIAGNOSIS — R195 Other fecal abnormalities: Secondary | ICD-10-CM | POA: Diagnosis not present

## 2020-04-25 DIAGNOSIS — K601 Chronic anal fissure: Secondary | ICD-10-CM | POA: Diagnosis not present

## 2020-04-25 DIAGNOSIS — K625 Hemorrhage of anus and rectum: Secondary | ICD-10-CM | POA: Diagnosis not present

## 2020-11-08 ENCOUNTER — Encounter (HOSPITAL_COMMUNITY): Payer: Self-pay | Admitting: *Deleted

## 2020-11-08 ENCOUNTER — Other Ambulatory Visit: Payer: Self-pay

## 2020-11-08 ENCOUNTER — Emergency Department (HOSPITAL_COMMUNITY): Payer: Commercial Managed Care - PPO

## 2020-11-08 ENCOUNTER — Emergency Department (HOSPITAL_COMMUNITY)
Admission: EM | Admit: 2020-11-08 | Discharge: 2020-11-08 | Disposition: A | Discharge status: Home / Self Care | Payer: Commercial Managed Care - PPO | Attending: Emergency Medicine | Admitting: Emergency Medicine

## 2020-11-08 DIAGNOSIS — K612 Anorectal abscess: Secondary | ICD-10-CM

## 2020-11-08 DIAGNOSIS — K6289 Other specified diseases of anus and rectum: Secondary | ICD-10-CM | POA: Diagnosis present

## 2020-11-08 DIAGNOSIS — J45909 Unspecified asthma, uncomplicated: Secondary | ICD-10-CM | POA: Insufficient documentation

## 2020-11-08 DIAGNOSIS — K610 Anal abscess: Secondary | ICD-10-CM | POA: Diagnosis not present

## 2020-11-08 DIAGNOSIS — K611 Rectal abscess: Secondary | ICD-10-CM | POA: Insufficient documentation

## 2020-11-08 DIAGNOSIS — Z79899 Other long term (current) drug therapy: Secondary | ICD-10-CM | POA: Insufficient documentation

## 2020-11-08 LAB — CBC WITH DIFFERENTIAL/PLATELET
Abs Immature Granulocytes: 0 10*3/uL (ref 0.00–0.07)
Basophils Absolute: 0.1 10*3/uL (ref 0.0–0.1)
Basophils Relative: 1 %
Eosinophils Absolute: 0.2 10*3/uL (ref 0.0–0.5)
Eosinophils Relative: 4 %
HCT: 45 % (ref 39.0–52.0)
Hemoglobin: 14.9 g/dL (ref 13.0–17.0)
Immature Granulocytes: 0 %
Lymphocytes Relative: 37 %
Lymphs Abs: 1.9 10*3/uL (ref 0.7–4.0)
MCH: 26.8 pg (ref 26.0–34.0)
MCHC: 33.1 g/dL (ref 30.0–36.0)
MCV: 80.9 fL (ref 80.0–100.0)
Monocytes Absolute: 0.5 10*3/uL (ref 0.1–1.0)
Monocytes Relative: 10 %
Neutro Abs: 2.5 10*3/uL (ref 1.7–7.7)
Neutrophils Relative %: 48 %
Platelets: 180 10*3/uL (ref 150–400)
RBC: 5.56 MIL/uL (ref 4.22–5.81)
RDW: 12.8 % (ref 11.5–15.5)
WBC: 5.2 10*3/uL (ref 4.0–10.5)
nRBC: 0 % (ref 0.0–0.2)

## 2020-11-08 LAB — COMPREHENSIVE METABOLIC PANEL
ALT: 29 U/L (ref 0–44)
AST: 22 U/L (ref 15–41)
Albumin: 3.9 g/dL (ref 3.5–5.0)
Alkaline Phosphatase: 46 U/L (ref 38–126)
Anion gap: 8 (ref 5–15)
BUN: 15 mg/dL (ref 6–20)
CO2: 24 mmol/L (ref 22–32)
Calcium: 9 mg/dL (ref 8.9–10.3)
Chloride: 104 mmol/L (ref 98–111)
Creatinine, Ser: 0.98 mg/dL (ref 0.61–1.24)
GFR, Estimated: >60 mL/min (ref >60)
Glucose, Bld: 100 mg/dL — ABNORMAL HIGH (ref 70–99)
Potassium: 3.9 mmol/L (ref 3.5–5.1)
Sodium: 136 mmol/L (ref 135–145)
Total Bilirubin: 0.8 mg/dL (ref 0.3–1.2)
Total Protein: 7.1 g/dL (ref 6.5–8.1)

## 2020-11-08 LAB — LACTIC ACID, PLASMA: Lactic Acid, Venous: 0.7 mmol/L (ref 0.5–1.9)

## 2020-11-08 MED ORDER — IBUPROFEN 800 MG PO TABS: 800.0000 mg | ORAL_TABLET | Freq: Three times a day (TID) | ORAL | 0 refills | Status: DC

## 2020-11-08 MED ORDER — HYDROMORPHONE HCL 1 MG/ML IJ SOLN
1.0000 mg | Freq: Once | INTRAMUSCULAR | Status: AC
  Administered 2020-11-08: 1 mg via INTRAVENOUS
  Filled 2020-11-08: qty 1

## 2020-11-08 MED ORDER — AMOXICILLIN-POT CLAVULANATE 875-125 MG PO TABS: 1.0000 | ORAL_TABLET | Freq: Two times a day (BID) | ORAL | 0 refills | Status: AC

## 2020-11-08 MED ORDER — METRONIDAZOLE IN NACL 5-0.79 MG/ML-% IV SOLN
500.0000 mg | Freq: Once | INTRAVENOUS | Status: AC
  Administered 2020-11-08: 500 mg via INTRAVENOUS
  Filled 2020-11-08: qty 100

## 2020-11-08 MED ORDER — DOCUSATE SODIUM 100 MG PO CAPS: 100.0000 mg | ORAL_CAPSULE | Freq: Two times a day (BID) | ORAL | 0 refills | Status: AC

## 2020-11-08 MED ORDER — SODIUM CHLORIDE 0.9 % IV SOLN
2.0000 g | Freq: Once | INTRAVENOUS | Status: AC
  Administered 2020-11-08: 2 g via INTRAVENOUS
  Filled 2020-11-08: qty 20

## 2020-11-08 MED ORDER — PREPARATION H 0.25-88.44 % RE SUPP
1.0000 | RECTAL | 0 refills | Status: AC | PRN
Start: 2020-11-08 — End: ?

## 2020-11-08 MED ORDER — IOHEXOL 300 MG/ML  SOLN
100.0000 mL | Freq: Once | INTRAMUSCULAR | Status: AC | PRN
  Administered 2020-11-08: 100 mL via INTRAVENOUS

## 2020-11-08 NOTE — ED Triage Notes (Signed)
No answer when called 

## 2020-11-08 NOTE — Discharge Instructions (Signed)
1.  Take your antibiotics as prescribed. 2.  Take ibuprofen every 8 hours if needed for pain control.  Take with food. 3.  Follow instructions for doing a SITZ bath 2-3 times per day.  And always do this after a bowel movement. 4.  Use the rectal suppositories twice a day to help with pain and swelling with bowel movements. 5.  Take the stool softener twice a day as prescribed.  Is very important that you do not become constipated.  Your stool should be soft and easy to pass. 6.  Make an appointment to see Dr. Octaviano Glow.  This is the gastroenterologist that you saw in the past.  Is very important that you get a follow-up appointment to make sure your abscess is improving. 7.  Return to the emergency department if your symptoms are worsening, your pain is increasing, you develop fever or other concerning symptoms.

## 2020-11-08 NOTE — ED Notes (Signed)
Patient transported to CT 

## 2020-11-08 NOTE — ED Provider Notes (Signed)
[facility] EMERGENCY DEPARTMENT Provider Note   CSN: [id number] Arrival date & time: 11/08/20  0229     History Chief Complaint  Patient presents with  . Hemorrhoids    David Larsen is a 35 y.o. male.  HPI Patient reports has had problems with hemorrhoids and rectal pain starting about a year ago.  He reports that it got better after getting treated with antibiotics and rectal suppository medications.  He reports he went for a number of months without having any symptoms.  He reports however about a month ago he started getting pain in the rectum again.  Reports over the past several days now the pain is so severe he cannot sit for any prolonged period of time.  Also he is noticing he is getting drainage spontaneously in his pants.  He denies any blood.  No associated abdominal pain.  No nausea no vomiting.  No fever.  He reports aside from the severe pain that he has in his rectum, he is otherwise feeling well.    Past Medical History:  Diagnosis Date  . Asthma     There are no problems to display for this patient.   History reviewed. No pertinent surgical history.     Family History  Problem Relation Age of Onset  . Hypertension Father     Social History   Tobacco Use  . Smoking status: Never Smoker  . Smokeless tobacco: Never Used  Vaping Use  . Vaping Use: Never used  Substance Use Topics  . Alcohol use: No  . Drug use: Never    Home Medications Prior to Admission medications   Medication Sig Start Date End Date Taking? Authorizing Provider  amoxicillin-clavulanate (AUGMENTIN) 875-125 MG tablet Take 1 tablet by mouth 2 (two) times daily. One po bid x 7 days 11/08/20  Yes Huntley Demedeiros, Lebron Conners, MD  docusate sodium (COLACE) 100 MG capsule Take 1 capsule (100 mg total) by mouth every 12 (twelve) hours. 11/08/20  Yes Arby Barrette, MD  ibuprofen (ADVIL) 800 MG tablet Take 1 tablet (800 mg total) by mouth 3 (three) times daily. 11/08/20  Yes  Arby Barrette, MD  shark liver oil-cocoa butter (PREPARATION H) 0.25-88.44 % suppository Place 1 suppository rectally as needed for hemorrhoids. 11/08/20  Yes Arby Barrette, MD  amoxicillin-clavulanate (AUGMENTIN) 875-125 MG tablet Take 1 tablet by mouth every 12 (twelve) hours. 01/23/20   Petrucelli, Samantha R, PA-C  docusate sodium (COLACE) 100 MG capsule Take 1 capsule (100 mg total) by mouth every 12 (twelve) hours. 01/03/20   Garlon Hatchet, PA-C  hydrocortisone (ANUSOL-HC) 2.5 % rectal cream Place 1 application rectally 3 (three) times daily as needed for hemorrhoids or anal itching. 12/19/19   Cathren Laine, MD  hydrocortisone (ANUSOL-HC) 25 MG suppository Place 1 suppository (25 mg total) rectally 2 (two) times daily. 01/03/20   Garlon Hatchet, PA-C  Lidocaine, Anorectal, 5 % GEL Apply externally every 4-6 hours as needed for rectal pain. 01/23/20   Petrucelli, Samantha R, PA-C  albuterol (VENTOLIN HFA) 108 (90 Base) MCG/ACT inhaler Inhale 2 puffs into the lungs every 6 (six) hours as needed for wheezing or shortness of breath. Patient not taking: Reported on 01/03/2020 05/26/19 01/23/20  Rodriguez-Southworth, Nettie Elm, PA-C  budesonide-formoterol [facility]) 80-4.5 MCG/ACT inhaler Inhale 2 puffs into the lungs daily. Patient not taking: Reported on 01/03/2020 05/26/19 01/23/20  Rodriguez-Southworth, Nettie Elm, PA-C  famotidine (PEPCID) 20 MG tablet Take 1 tablet (20 mg total) by mouth 2 (two) times daily. Patient  not taking: Reported on 01/03/2020 12/12/16 01/23/20  Janne Napoleon, NP    Allergies    Patient has no known allergies.  Review of Systems   Review of Systems 10 systems reviewed negative except as per HPI Physical Exam Updated Vital Signs BP 119/77   Pulse 62   Temp 98.7 F (37.1 C) (Oral)   Resp 13   Ht 6\' 1"  (1.854 m)   Wt 111.1 kg   SpO2 96%   BMI 32.31 kg/m   Physical Exam Constitutional:      Appearance: Normal appearance.  Eyes:     Extraocular Movements: Extraocular  movements intact.  Cardiovascular:     Rate and Rhythm: Normal rate and regular rhythm.  Pulmonary:     Effort: Pulmonary effort is normal.     Breath sounds: Normal breath sounds.  Abdominal:     General: There is no distension.     Palpations: Abdomen is soft.     Tenderness: There is no abdominal tenderness. There is no guarding.  Genitourinary:    Comments: Creamy pale yellow discharge between the gluteal cleft.  Anus has a focal, somewhat fusiform swelling at the 12:00 position.  There is overlying focus of drainage of purulent material.  Digital rectal exam extremely painful for the patient.  Internal palpation and palpation from external aspect, there is a small marble sized swelling.  This is very firm.  With pressure from internal to external, active drainage from the abscess. Musculoskeletal:        General: Normal range of motion.  Skin:    General: Skin is warm and dry.  Neurological:     General: No focal deficit present.     Mental Status: He is alert and oriented to person, place, and time.     Coordination: Coordination normal.  Psychiatric:        Mood and Affect: Mood normal.     ED Results / Procedures / Treatments   Labs (all labs ordered are listed, but only abnormal results are displayed) Labs Reviewed  COMPREHENSIVE METABOLIC PANEL - Abnormal; Notable for the following components:      Result Value   Glucose, Bld 100 (*)    All other components within normal limits  LACTIC ACID, PLASMA  CBC WITH DIFFERENTIAL/PLATELET  LACTIC ACID, PLASMA    EKG None  Radiology CT PELVIS W CONTRAST  Result Date: 11/08/2020 CLINICAL DATA:  Anorectal abscess versus hemorrhoids, pain for 1 year intermittently, unable to sit down EXAM: CT PELVIS WITH CONTRAST TECHNIQUE: Multidetector CT imaging of the pelvis was performed using the standard protocol following the bolus administration of intravenous contrast. CONTRAST:  01/06/2021 OMNIPAQUE IOHEXOL 300 MG/ML  SOLN COMPARISON:   12/29/2019 FINDINGS: Urinary Tract:  Ureters and bladder normal appearance Bowel: Normal appendix. Visualized large and small bowel loops unremarkable. Specifically no perirectal infiltrative changes seen. Perianal region unremarkable. Vascular/Lymphatic: No pelvic adenopathy. Normal sized inguinal nodes bilaterally. Vascular structures unremarkable. Tiny node identified in the perirectal fat dorsally on RIGHT, 8 mm short axis. Reproductive:  Unremarkable prostate gland and seminal vesicles Other: No free air or free fluid. No evidence of abscess or perianal/perirectal infiltrative changes. Multiple injection granulomata within the buttocks bilaterally. Musculoskeletal: Symmetric muscular planes. Osseous structures unremarkable. IMPRESSION: No acute intrapelvic abnormalities. Specifically, no evidence of perirectal/perianal abscess or inflammatory changes. Electronically Signed   By: 12/31/2019 M.D.   On: 11/08/2020 10:38    Procedures Procedures   Medications Ordered in ED Medications  cefTRIAXone (ROCEPHIN)  2 g in sodium chloride 0.9 % 100 mL IVPB (0 g Intravenous Stopped 11/08/20 0933)  metroNIDAZOLE (FLAGYL) IVPB 500 mg (0 mg Intravenous Stopped 11/08/20 1035)  HYDROmorphone (DILAUDID) injection 1 mg (1 mg Intravenous Given 11/08/20 0845)  iohexol (OMNIPAQUE) 300 MG/ML solution 100 mL (100 mLs Intravenous Contrast Given 11/08/20 1031)    ED Course  I have reviewed the triage vital signs and the nursing notes.  Pertinent labs & imaging results that were available during my care of the patient were reviewed by me and considered in my medical decision making (see chart for details).    MDM Rules/Calculators/A&P                         Patient has a marble sized abscess in the perianal region.  This is quite firm to palpation.  With pressure purulence can be expressed.  This is exquisitely painful for the patient.  No blood present.  Patient describes symptoms as progressive over a months duration.   He has not tried any home treatment.  We will proceed with CT scan to rule out any more extensive proctitis or deep space infection.  Will empirically start antibiotics Rocephin and Flagyl.  Will provide pain control.  Patient is nontoxic and alert without other complaints.  CT does not show any extension of this focal anal abscess. At this time will proceed with outpatient treatment. Abscess is draining at this time. Patient is instructed on home care, return precautions and follow-up with his gastroenterologist at the beginning of the week. Patient will need reassessment by gastroenterology to determine if he has some small persistent fistula given the frequent recurrence of this problem over the past year. Final Clinical Impression(s) / ED Diagnoses Final diagnoses:  Abscess of anal and rectal regions    Rx / DC Orders ED Discharge Orders         Ordered    amoxicillin-clavulanate (AUGMENTIN) 875-125 MG tablet  2 times daily        11/08/20 1105    shark liver oil-cocoa butter (PREPARATION H) 0.25-88.44 % suppository  As needed        11/08/20 1105    docusate sodium (COLACE) 100 MG capsule  Every 12 hours        11/08/20 1105    ibuprofen (ADVIL) 800 MG tablet  3 times daily        11/08/20 1105           Arby Barrette, MD 11/08/20 1118

## 2020-11-08 NOTE — ED Triage Notes (Signed)
The pt is c/o hemorrhoids for one year. Intermittent pains unable to sit down

## 2020-11-08 NOTE — ED Provider Notes (Signed)
Chart has been reviewed.  Patient is not in the room.   Arby Barrette, MD 11/08/20 [id number]

## 2020-12-08 ENCOUNTER — Other Ambulatory Visit: Payer: Self-pay

## 2020-12-08 ENCOUNTER — Emergency Department (HOSPITAL_COMMUNITY)
Admission: EM | Admit: 2020-12-08 | Discharge: 2020-12-08 | Payer: Commercial Managed Care - PPO | Attending: Emergency Medicine | Admitting: Emergency Medicine

## 2020-12-08 DIAGNOSIS — J45909 Unspecified asthma, uncomplicated: Secondary | ICD-10-CM | POA: Diagnosis not present

## 2020-12-08 DIAGNOSIS — K625 Hemorrhage of anus and rectum: Secondary | ICD-10-CM | POA: Diagnosis present

## 2020-12-08 LAB — BASIC METABOLIC PANEL
Anion gap: 9 (ref 5–15)
BUN: 14 mg/dL (ref 6–20)
CO2: 26 mmol/L (ref 22–32)
Calcium: 9.3 mg/dL (ref 8.9–10.3)
Chloride: 102 mmol/L (ref 98–111)
Creatinine, Ser: 0.87 mg/dL (ref 0.61–1.24)
GFR, Estimated: >60 mL/min (ref >60)
Glucose, Bld: 99 mg/dL (ref 70–99)
Potassium: 4.2 mmol/L (ref 3.5–5.1)
Sodium: 137 mmol/L (ref 135–145)

## 2020-12-08 LAB — TYPE AND SCREEN
ABO/RH(D): O NEG
Antibody Screen: NEGATIVE

## 2020-12-08 LAB — CBC WITH DIFFERENTIAL/PLATELET
Abs Immature Granulocytes: 0.01 10*3/uL (ref 0.00–0.07)
Basophils Absolute: 0.1 10*3/uL (ref 0.0–0.1)
Basophils Relative: 1 %
Eosinophils Absolute: 0.5 10*3/uL (ref 0.0–0.5)
Eosinophils Relative: 9 %
HCT: 44.4 % (ref 39.0–52.0)
Hemoglobin: 15.2 g/dL (ref 13.0–17.0)
Immature Granulocytes: 0 %
Lymphocytes Relative: 37 %
Lymphs Abs: 2.1 10*3/uL (ref 0.7–4.0)
MCH: 27.5 pg (ref 26.0–34.0)
MCHC: 34.2 g/dL (ref 30.0–36.0)
MCV: 80.4 fL (ref 80.0–100.0)
Monocytes Absolute: 0.4 10*3/uL (ref 0.1–1.0)
Monocytes Relative: 8 %
Neutro Abs: 2.6 10*3/uL (ref 1.7–7.7)
Neutrophils Relative %: 45 %
Platelets: 196 10*3/uL (ref 150–400)
RBC: 5.52 MIL/uL (ref 4.22–5.81)
RDW: 12.8 % (ref 11.5–15.5)
WBC: 5.7 10*3/uL (ref 4.0–10.5)
nRBC: 0 % (ref 0.0–0.2)

## 2020-12-08 MED ORDER — SODIUM CHLORIDE 0.9 % IV SOLN: INTRAVENOUS | Status: DC

## 2020-12-08 MED ORDER — MORPHINE SULFATE (PF) 4 MG/ML IV SOLN
6.0000 mg | Freq: Once | INTRAVENOUS | Status: AC
Start: 2020-12-08 — End: 2020-12-08
  Administered 2020-12-08: 6 mg via INTRAVENOUS
  Filled 2020-12-08: qty 2

## 2020-12-08 NOTE — ED Notes (Signed)
Pt states "There is too much blood." Pt noted to have saturated brief and chucks pad with one large clot. This RN performed pericare and changed brief and pad

## 2020-12-08 NOTE — ED Triage Notes (Signed)
Pt came from home via EMS. Pt profusely bleeding from rectum for the past 2 days. Pt had an anal fistulotomy at [facility] on 2/23 for correction of hemmorhoids. Pt was seen at [facility] yesterday for same complaint but not much was done. No Hypotension, no AMS.

## 2020-12-08 NOTE — ED Notes (Signed)
[facility] called. Transfer paper work at nurses station

## 2020-12-08 NOTE — ED Provider Notes (Signed)
[facility]-EMERGENCY DEPT Provider Note   CSN: [id number] Arrival date & time: 12/08/20  7035     History Chief Complaint  Patient presents with  . Rectal Bleeding    David Larsen is a 35 y.o. male.  35 year old male presents with rectal bleeding times several days.  Patient had perianal fistula surgery done almost 2 weeks ago.  Has had bright red blood per rectum times several days.  Seen at [facility] hospital for same yesterday.  Physician had consulted general surgery who recommended that patient to be observed in wound allowed to heal by secondary intention.  Patient states that the bleeding has increased.  He has soaked through multiple pads.        Past Medical History:  Diagnosis Date  . Asthma     There are no problems to display for this patient.   No past surgical history on file.     Family History  Problem Relation Age of Onset  . Hypertension Father     Social History   Tobacco Use  . Smoking status: Never Smoker  . Smokeless tobacco: Never Used  Vaping Use  . Vaping Use: Never used  Substance Use Topics  . Alcohol use: No  . Drug use: Never    Home Medications Prior to Admission medications   Medication Sig Start Date End Date Taking? Authorizing Provider  amoxicillin-clavulanate (AUGMENTIN) 875-125 MG tablet Take 1 tablet by mouth every 12 (twelve) hours. 01/23/20   Petrucelli, Samantha R, PA-C  amoxicillin-clavulanate (AUGMENTIN) 875-125 MG tablet Take 1 tablet by mouth 2 (two) times daily. One po bid x 7 days 11/08/20   Arby Barrette, MD  docusate sodium (COLACE) 100 MG capsule Take 1 capsule (100 mg total) by mouth every 12 (twelve) hours. 01/03/20   Garlon Hatchet, PA-C  docusate sodium (COLACE) 100 MG capsule Take 1 capsule (100 mg total) by mouth every 12 (twelve) hours. 11/08/20   Arby Barrette, MD  hydrocortisone (ANUSOL-HC) 2.5 % rectal cream Place 1 application rectally 3 (three) times daily as  needed for hemorrhoids or anal itching. 12/19/19   Cathren Laine, MD  hydrocortisone (ANUSOL-HC) 25 MG suppository Place 1 suppository (25 mg total) rectally 2 (two) times daily. 01/03/20   Garlon Hatchet, PA-C  ibuprofen (ADVIL) 800 MG tablet Take 1 tablet (800 mg total) by mouth 3 (three) times daily. 11/08/20   Arby Barrette, MD  Lidocaine, Anorectal, 5 % GEL Apply externally every 4-6 hours as needed for rectal pain. 01/23/20   Petrucelli, Lelon Mast R, PA-C  shark liver oil-cocoa butter (PREPARATION H) 0.25-88.44 % suppository Place 1 suppository rectally as needed for hemorrhoids. 11/08/20   Arby Barrette, MD  albuterol (VENTOLIN HFA) 108 (90 Base) MCG/ACT inhaler Inhale 2 puffs into the lungs every 6 (six) hours as needed for wheezing or shortness of breath. Patient not taking: Reported on 01/03/2020 05/26/19 01/23/20  Rodriguez-Southworth, Nettie Elm, PA-C  budesonide-formoterol [facility]) 80-4.5 MCG/ACT inhaler Inhale 2 puffs into the lungs daily. Patient not taking: Reported on 01/03/2020 05/26/19 01/23/20  Rodriguez-Southworth, Nettie Elm, PA-C  famotidine (PEPCID) 20 MG tablet Take 1 tablet (20 mg total) by mouth 2 (two) times daily. Patient not taking: Reported on 01/03/2020 12/12/16 01/23/20  Janne Napoleon, NP    Allergies    Patient has no known allergies.  Review of Systems   Review of Systems  All other systems reviewed and are negative.   Physical Exam Updated Vital Signs BP (!) 167/98 (BP Location:  Right Arm)   Pulse 61   Temp 98.4 F (36.9 C) (Oral)   Resp 15   Ht 1.854 m (6\' 1" )   Wt 127 kg   SpO2 100%   BMI 36.94 kg/m   Physical Exam Vitals and nursing note reviewed.  Constitutional:      General: He is not in acute distress.    Appearance: Normal appearance. He is well-developed and well-nourished. He is not toxic-appearing.  HENT:     Head: Normocephalic and atraumatic.  Eyes:     General: Lids are normal.     Extraocular Movements: EOM normal.     Conjunctiva/sclera:  Conjunctivae normal.     Pupils: Pupils are equal, round, and reactive to light.  Neck:     Thyroid: No thyroid mass.     Trachea: No tracheal deviation.  Cardiovascular:     Rate and Rhythm: Normal rate and regular rhythm.     Heart sounds: Normal heart sounds. No murmur heard. No gallop.   Pulmonary:     Effort: Pulmonary effort is normal. No respiratory distress.     Breath sounds: Normal breath sounds. No stridor. No decreased breath sounds, wheezing, rhonchi or rales.  Abdominal:     General: Bowel sounds are normal. There is no distension.     Palpations: Abdomen is soft.     Tenderness: There is no abdominal tenderness. There is no CVA tenderness or rebound.  Genitourinary:    Comments: Active bright red blood noted coming out of the prior surgical incision site Musculoskeletal:        General: No tenderness or edema. Normal range of motion.     Cervical back: Normal range of motion and neck supple.  Skin:    General: Skin is warm and dry.     Findings: No abrasion or rash.  Neurological:     Mental Status: He is alert and oriented to person, place, and time.     GCS: GCS eye subscore is 4. GCS verbal subscore is 5. GCS motor subscore is 6.     Cranial Nerves: No cranial nerve deficit.     Sensory: No sensory deficit.     Deep Tendon Reflexes: Strength normal.  Psychiatric:        Mood and Affect: Mood and affect normal.        Speech: Speech normal.        Behavior: Behavior normal.     ED Results / Procedures / Treatments   Labs (all labs ordered are listed, but only abnormal results are displayed) Labs Reviewed  CBC WITH DIFFERENTIAL/PLATELET  BASIC METABOLIC PANEL  TYPE AND SCREEN    EKG None  Radiology No results found.  Procedures Procedures   Medications Ordered in ED Medications  0.9 %  sodium chloride infusion (has no administration in time range)    ED Course  I have reviewed the triage vital signs and the nursing notes.  Pertinent labs  & imaging results that were available during my care of the patient were reviewed by me and considered in my medical decision making (see chart for details).    MDM Rules/Calculators/A&P                          Bleeding controlled with pressure D/w with surgeon at [facility], has agreed to see patient in the ED.  Will transfer patient there Final Clinical Impression(s) / ED Diagnoses Final diagnoses:  None  Rx / DC Orders ED Discharge Orders    None       Lorre Nick, MD 12/08/20 1047

## 2021-09-26 ENCOUNTER — Ambulatory Visit (INDEPENDENT_AMBULATORY_CARE_PROVIDER_SITE_OTHER): Payer: 59 | Admitting: Surgery

## 2021-09-26 ENCOUNTER — Encounter: Payer: Self-pay | Admitting: Surgery

## 2021-09-26 ENCOUNTER — Other Ambulatory Visit: Payer: Self-pay

## 2021-09-26 ENCOUNTER — Ambulatory Visit: Payer: Self-pay

## 2021-09-26 VITALS — BP 144/97 | HR 73 | Ht 73.0 in | Wt 280.0 lb

## 2021-09-26 DIAGNOSIS — M25571 Pain in right ankle and joints of right foot: Secondary | ICD-10-CM | POA: Diagnosis not present

## 2021-09-26 DIAGNOSIS — M2550 Pain in unspecified joint: Secondary | ICD-10-CM

## 2021-09-26 DIAGNOSIS — M79672 Pain in left foot: Secondary | ICD-10-CM | POA: Diagnosis not present

## 2021-09-26 DIAGNOSIS — M25561 Pain in right knee: Secondary | ICD-10-CM

## 2021-09-26 MED ORDER — METHYLPREDNISOLONE 4 MG PO TABS: ORAL_TABLET | ORAL | 0 refills | Status: DC

## 2021-09-26 NOTE — Progress Notes (Signed)
Office Visit Note   Patient: David Larsen           Date of Birth: Oct 14, 1985           MRN: [id number] Visit Date: 09/26/2021              Requested by: Shelby Mattocks, PA-C [address],  [place] [address] PCP: Shelby Mattocks, PA-C   Assessment & Plan: Visit Diagnoses:  1. Acute pain of right knee   2. Polyarthralgia   3. Pain in right ankle and joints of right foot   4. Pain of left heel     Plan: With patient's multiple areas of pain I sent in a prescription for Medrol Dosepak 6-day taper to be taken as directed.  Blood work drawn today to check a CBC and arthritis panel.  wondering if his pain may be gout related.  Follow with Dr. Lorin Mercy in 2 weeks for recheck  Follow-Up Instructions: Return in about 2 weeks (around 10/10/2021) for WITH DR Ely RECHECK OF RIGHT KNEE/FOOT AND REVIEW LABS.   Orders:  Orders Placed This Encounter  Procedures   XR KNEE 3 VIEW RIGHT   CBC   Antinuclear Antib (ANA)   Uric acid   Rheumatoid Factor   Sed Rate (ESR)   Meds ordered this encounter  Medications   methylPREDNISolone (MEDROL) 4 MG tablet    Sig: 6 DAY TAPER TO BE TAKEN AS DIRECTED.    Dispense:  21 tablet    Refill:  0      Procedures: No procedures performed   Clinical Data: No additional findings.   Subjective: Chief Complaint  Patient presents with   Right Knee - Pain    HPI 35 year old black male who is new patient to clinic comes in today with complaints of right knee pain, right foot pain and left heel pain.  States the pain in all areas ongoing for couple weeks.  No injury.  Aggravated when he is walking.  States that he has had swelling in his right foot.  Denies history of fever chills.  Denies personal history of gout but states that his father has been diagnosed with this.  He has used ibuprofen without much help.  Denies history of diabetes.  I reviewed patient's chart and he has had problems off-and-on  with a perianal fistula that required surgery.  He has had previous issues with wound healing.  States that this area is no longer a problem and has completely healed. Review of Systems No current cardiac pulmonary GI GU issues  Objective: Vital Signs: BP (!) 144/97    Pulse 73    Ht 6' 1"  (1.854 m)    Wt 280 lb (127 kg)    BMI 36.94 kg/m   Physical Exam Constitutional:      Appearance: He is obese.  HENT:     Head: Normocephalic and atraumatic.     Nose: Nose normal.  Eyes:     Extraocular Movements: Extraocular movements intact.  Pulmonary:     Effort: No respiratory distress.  Musculoskeletal:     Comments: Gait is antalgic.  Negative logroll bilateral hips.  Negative straight leg raise.  Right knee he has some diffuse tenderness.  No signs infection.  No palpable effusion.  Right foot he does have some swelling.  No signs of infection.  Midfoot tender to palpation along with the first MTP joint.  Left foot he has some tenderness along the plantar fascial calcaneal  insertion.  No swelling or bruising noted.  Neurological:     Mental Status: He is alert and oriented to person, place, and time.  Psychiatric:        Mood and Affect: Mood normal.    Ortho Exam  Specialty Comments:  No specialty comments available.  Imaging: No results found.   PMFS History: There are no problems to display for this patient.  Past Medical History:  Diagnosis Date   Asthma     Family History  Problem Relation Age of Onset   Hypertension Father     Past Surgical History:  Procedure Laterality Date   HEMORRHOID SURGERY     Social History   Occupational History   Not on file  Tobacco Use   Smoking status: Never   Smokeless tobacco: Never  Vaping Use   Vaping Use: Never used  Substance and Sexual Activity   Alcohol use: No   Drug use: Never   Sexual activity: Not on file

## 2021-09-28 LAB — CBC
HCT: 46.7 % (ref 38.5–50.0)
Hemoglobin: 15.7 g/dL (ref 13.2–17.1)
MCH: 27.6 pg (ref 27.0–33.0)
MCHC: 33.6 g/dL (ref 32.0–36.0)
MCV: 82.1 fL (ref 80.0–100.0)
MPV: 10.8 fL (ref 7.5–12.5)
Platelets: 215 10*3/uL (ref 140–400)
RBC: 5.69 10*6/uL (ref 4.20–5.80)
RDW: 13.1 % (ref 11.0–15.0)
WBC: 5.3 10*3/uL (ref 3.8–10.8)

## 2021-09-28 LAB — ANA: Anti Nuclear Antibody (ANA): NEGATIVE

## 2021-09-28 LAB — SEDIMENTATION RATE: Sed Rate: 2 mm/h (ref 0–15)

## 2021-09-28 LAB — URIC ACID: Uric Acid, Serum: 7.3 mg/dL (ref 4.0–8.0)

## 2021-09-28 LAB — RHEUMATOID FACTOR: Rheumatoid fact SerPl-aCnc: <14 IU/mL (ref <14)

## 2021-09-30 ENCOUNTER — Emergency Department (HOSPITAL_COMMUNITY)
Admission: EM | Admit: 2021-09-30 | Discharge: 2021-09-30 | Disposition: A | Discharge status: Home / Self Care | Payer: 59 | Attending: Emergency Medicine | Admitting: Emergency Medicine

## 2021-09-30 ENCOUNTER — Emergency Department (HOSPITAL_COMMUNITY): Payer: 59

## 2021-09-30 ENCOUNTER — Encounter (HOSPITAL_COMMUNITY): Payer: Self-pay | Admitting: Emergency Medicine

## 2021-09-30 ENCOUNTER — Other Ambulatory Visit: Payer: Self-pay

## 2021-09-30 DIAGNOSIS — M25562 Pain in left knee: Secondary | ICD-10-CM | POA: Insufficient documentation

## 2021-09-30 DIAGNOSIS — Z7951 Long term (current) use of inhaled steroids: Secondary | ICD-10-CM | POA: Diagnosis not present

## 2021-09-30 DIAGNOSIS — J45909 Unspecified asthma, uncomplicated: Secondary | ICD-10-CM | POA: Insufficient documentation

## 2021-09-30 MED ORDER — COLCHICINE 0.6 MG PO TABS
1.2000 mg | ORAL_TABLET | Freq: Once | ORAL | Status: AC
  Administered 2021-09-30: 12:00:00 1.2 mg via ORAL
  Filled 2021-09-30: qty 2

## 2021-09-30 MED ORDER — KETOROLAC TROMETHAMINE 15 MG/ML IJ SOLN
15.0000 mg | Freq: Once | INTRAMUSCULAR | Status: AC
  Administered 2021-09-30: 12:00:00 15 mg via INTRAMUSCULAR
  Filled 2021-09-30: qty 1

## 2021-09-30 MED ORDER — COLCHICINE 0.6 MG PO TABS: 0.6000 mg | ORAL_TABLET | Freq: Once | ORAL | 0 refills | Status: AC

## 2021-09-30 NOTE — ED Provider Notes (Signed)
[facility] EMERGENCY DEPARTMENT Provider Note   CSN: [id number] Arrival date & time: 09/30/21  [id number]     History Chief Complaint  Patient presents with   Knee Pain    David Larsen is a 35 y.o. male.  35 yo M with migrating arthropathies.  Patient tells me that he had pain in his right knee last week and now has it in his left knee.  Has seen the orthopedic doctor in the office who had done some laboratory evaluation and started on steroids and transiently got better and then reoccurred.  He denies trauma to the area denies fevers or chills.  Pain with range of motion.  The history is provided by the patient.  Knee Pain Location:  Knee Time since incident:  2 days Injury: no   Knee location:  L knee Pain details:    Quality:  Aching   Radiates to:  Does not radiate   Severity:  Moderate   Onset quality:  Gradual   Duration:  2 days   Timing:  Constant   Progression:  Worsening Chronicity:  Recurrent Dislocation: no   Prior injury to area:  No Relieved by:  Nothing Worsened by:  Bearing weight, extension and flexion Ineffective treatments:  None tried Associated symptoms: no fever       Past Medical History:  Diagnosis Date   Asthma     There are no problems to display for this patient.   Past Surgical History:  Procedure Laterality Date   HEMORRHOID SURGERY         Family History  Problem Relation Age of Onset   Hypertension Father     Social History   Tobacco Use   Smoking status: Never   Smokeless tobacco: Never  Vaping Use   Vaping Use: Never used  Substance Use Topics   Alcohol use: No   Drug use: Never    Home Medications Prior to Admission medications   Medication Sig Start Date End Date Taking? Authorizing Provider  colchicine 0.6 MG tablet Take 1 tablet (0.6 mg total) by mouth once for 1 dose. Try to take 1 hour after your medicine at the hospital 09/30/21 09/30/21 Yes Deno Etienne, DO   amoxicillin-clavulanate (AUGMENTIN) 875-125 MG tablet Take 1 tablet by mouth every 12 (twelve) hours. 01/23/20   Petrucelli, Samantha R, PA-C  amoxicillin-clavulanate (AUGMENTIN) 875-125 MG tablet Take 1 tablet by mouth 2 (two) times daily. One po bid x 7 days 11/08/20   Charlesetta Shanks, MD  docusate sodium (COLACE) 100 MG capsule Take 1 capsule (100 mg total) by mouth every 12 (twelve) hours. 01/03/20   Larene Pickett, PA-C  docusate sodium (COLACE) 100 MG capsule Take 1 capsule (100 mg total) by mouth every 12 (twelve) hours. 11/08/20   Charlesetta Shanks, MD  hydrocortisone (ANUSOL-HC) 2.5 % rectal cream Place 1 application rectally 3 (three) times daily as needed for hemorrhoids or anal itching. 12/19/19   Lajean Saver, MD  hydrocortisone (ANUSOL-HC) 25 MG suppository Place 1 suppository (25 mg total) rectally 2 (two) times daily. 01/03/20   Larene Pickett, PA-C  ibuprofen (ADVIL) 800 MG tablet Take 1 tablet (800 mg total) by mouth 3 (three) times daily. 11/08/20   Charlesetta Shanks, MD  Lidocaine, Anorectal, 5 % GEL Apply externally every 4-6 hours as needed for rectal pain. 01/23/20   Petrucelli, Samantha R, PA-C  methylPREDNISolone (MEDROL) 4 MG tablet 6 DAY TAPER TO BE TAKEN AS DIRECTED. 09/26/21   Benjiman Core  M, PA-C  shark liver oil-cocoa butter (PREPARATION H) 0.25-88.44 % suppository Place 1 suppository rectally as needed for hemorrhoids. 11/08/20   Arby Barrette, MD  albuterol (VENTOLIN HFA) 108 (90 Base) MCG/ACT inhaler Inhale 2 puffs into the lungs every 6 (six) hours as needed for wheezing or shortness of breath. Patient not taking: Reported on 01/03/2020 05/26/19 01/23/20  Rodriguez-Southworth, Nettie Elm, PA-C  budesonide-formoterol [facility]) 80-4.5 MCG/ACT inhaler Inhale 2 puffs into the lungs daily. Patient not taking: Reported on 01/03/2020 05/26/19 01/23/20  Rodriguez-Southworth, Nettie Elm, PA-C  famotidine (PEPCID) 20 MG tablet Take 1 tablet (20 mg total) by mouth 2 (two) times daily. Patient not  taking: Reported on 01/03/2020 12/12/16 01/23/20  Janne Napoleon, NP    Allergies    Patient has no known allergies.  Review of Systems   Review of Systems  Constitutional:  Negative for chills and fever.  HENT:  Negative for congestion and facial swelling.   Eyes:  Negative for discharge and visual disturbance.  Respiratory:  Negative for shortness of breath.   Cardiovascular:  Negative for chest pain and palpitations.  Gastrointestinal:  Negative for abdominal pain, diarrhea and vomiting.  Musculoskeletal:  Positive for arthralgias. Negative for myalgias.  Skin:  Negative for color change and rash.  Neurological:  Negative for tremors, syncope and headaches.  Psychiatric/Behavioral:  Negative for confusion and dysphoric mood.    Physical Exam Updated Vital Signs BP (!) 153/112 (BP Location: Right Arm)    Pulse 84    Temp 98.7 F (37.1 C) (Oral)    Resp 20    SpO2 98%   Physical Exam Vitals and nursing note reviewed.  Constitutional:      Appearance: He is well-developed.  HENT:     Head: Normocephalic and atraumatic.  Eyes:     Pupils: Pupils are equal, round, and reactive to light.  Neck:     Vascular: No JVD.  Cardiovascular:     Rate and Rhythm: Normal rate and regular rhythm.     Heart sounds: No murmur heard.   No friction rub. No gallop.  Pulmonary:     Effort: No respiratory distress.     Breath sounds: No wheezing.  Abdominal:     General: There is no distension.     Tenderness: There is no abdominal tenderness. There is no guarding or rebound.  Musculoskeletal:        General: Normal range of motion.     Cervical back: Normal range of motion and neck supple.     Comments: No obvious edema or warmth on my exam.  Patient restricted range of motion initially but once moving was able to move freely without significant issue.  No obvious ligamentous laxity.  No obvious joint effusion on exam.  Skin:    Coloration: Skin is not pale.     Findings: No rash.   Neurological:     Mental Status: He is alert and oriented to person, place, and time.  Psychiatric:        Behavior: Behavior normal.    ED Results / Procedures / Treatments   Labs (all labs ordered are listed, but only abnormal results are displayed) Labs Reviewed - No data to display  EKG None  Radiology DG Knee Complete 4 Views Left  Result Date: 09/30/2021 CLINICAL DATA:  35 year old male with left knee pain for 1 week. No known injury. EXAM: LEFT KNEE - COMPLETE 4+ VIEW COMPARISON:  Right knee series 09/26/2021. FINDINGS: Positive suprapatellar joint effusion on the  lateral view, small to moderate. Bone mineralization is within normal limits. No acute osseous abnormality identified. Intact patella. Joint spaces and alignment maintained. IMPRESSION: Small to moderate suprapatellar joint effusion. No acute osseous abnormality identified. Electronically Signed   By: Genevie Ann M.D.   On: 09/30/2021 08:18    Procedures Procedures   Medications Ordered in ED Medications  colchicine tablet 1.2 mg (has no administration in time range)  ketorolac (TORADOL) 15 MG/ML injection 15 mg (has no administration in time range)    ED Course  I have reviewed the triage vital signs and the nursing notes.  Pertinent labs & imaging results that were available during my care of the patient were reviewed by me and considered in my medical decision making (see chart for details).    MDM Rules/Calculators/A&P                         35 yo M with a chief complaints of migratory arthropathies.  Going on for a couple days.  On my exam patient with no obvious concern for septic arthritis.  I am able to range it without significant pain no erythema no obvious effusion on exam.  Plain film viewed by me without fracture, radiology read with concern for suprapatellar joint effusion.  Patient is complaining of the pain is very severe and difficulty with sleeping.  We will treat with colchicine.  I will have  him follow-up with his orthopedist to he just saw in the office this past week and has a follow-up appointment next week.  10:57 AM:  I have discussed the diagnosis/risks/treatment options with the patient and believe the pt to be eligible for discharge home to follow-up with Ortho. We also discussed returning to the ED immediately if new or worsening sx occur. We discussed the sx which are most concerning (e.g., sudden worsening pain, fever, inability to tolerate by mouth) that necessitate immediate return. Medications administered to the patient during their visit and any new prescriptions provided to the patient are listed below.  Medications given during this visit Medications  colchicine tablet 1.2 mg (has no administration in time range)  ketorolac (TORADOL) 15 MG/ML injection 15 mg (has no administration in time range)     The patient appears reasonably screen and/or stabilized for discharge and I doubt any other medical condition or other [facility] requiring further screening, evaluation, or treatment in the ED at this time prior to discharge.      Final Clinical Impression(s) / ED Diagnoses Final diagnoses:  Acute pain of left knee    Rx / DC Orders ED Discharge Orders          Ordered    colchicine 0.6 MG tablet   Once        09/30/21 [place], Lyndee Herbst, DO 09/30/21 1057

## 2021-09-30 NOTE — Discharge Instructions (Signed)
Take 4 over the counter ibuprofen tablets 3 times a day or 2 over-the-counter naproxen tablets twice a day for pain. Also take tylenol 1000mg(2 extra strength) four times a day.    

## 2021-09-30 NOTE — ED Notes (Signed)
Patient transported to X-ray 

## 2021-09-30 NOTE — ED Triage Notes (Signed)
Pt reports left knee pain for the last 2 weeks. Denies recent injury. Reports was seen by PMD last week for right knee pain and started taking steroid prescription today. Pt reports intermittent swelling but no fever. Pt ambulatory with limp.

## 2021-10-07 ENCOUNTER — Emergency Department (HOSPITAL_COMMUNITY)
Admission: EM | Admit: 2021-10-07 | Discharge: 2021-10-07 | Disposition: A | Discharge status: Home / Self Care | Payer: Managed Care, Other (non HMO) | Attending: Emergency Medicine | Admitting: Emergency Medicine

## 2021-10-07 ENCOUNTER — Encounter (HOSPITAL_COMMUNITY): Payer: Self-pay | Admitting: Emergency Medicine

## 2021-10-07 ENCOUNTER — Emergency Department (HOSPITAL_BASED_OUTPATIENT_CLINIC_OR_DEPARTMENT_OTHER): Payer: 59

## 2021-10-07 ENCOUNTER — Other Ambulatory Visit: Payer: Self-pay

## 2021-10-07 ENCOUNTER — Emergency Department (HOSPITAL_COMMUNITY): Payer: Managed Care, Other (non HMO)

## 2021-10-07 DIAGNOSIS — J45909 Unspecified asthma, uncomplicated: Secondary | ICD-10-CM | POA: Insufficient documentation

## 2021-10-07 DIAGNOSIS — Z20822 Contact with and (suspected) exposure to covid-19: Secondary | ICD-10-CM | POA: Insufficient documentation

## 2021-10-07 DIAGNOSIS — R509 Fever, unspecified: Secondary | ICD-10-CM | POA: Diagnosis present

## 2021-10-07 DIAGNOSIS — M79604 Pain in right leg: Secondary | ICD-10-CM | POA: Diagnosis not present

## 2021-10-07 DIAGNOSIS — J069 Acute upper respiratory infection, unspecified: Secondary | ICD-10-CM | POA: Diagnosis not present

## 2021-10-07 LAB — RESP PANEL BY RT-PCR (FLU A&B, COVID) ARPGX2
Influenza A by PCR: NEGATIVE
Influenza B by PCR: NEGATIVE
SARS Coronavirus 2 by RT PCR: NEGATIVE

## 2021-10-07 NOTE — ED Triage Notes (Signed)
Pt coming from home with multiple complaints. Fever and cough since yesterday, left leg pain x2 weeks. NAD.

## 2021-10-07 NOTE — Discharge Instructions (Addendum)
It was a pleasure taking care of you today!  Your chest x-ray was negative for pneumonia.  Your swab was negative for COVID or flu.  You may take over-the-counter medications as directed for your symptoms.  You may follow-up with your primary care provider as needed.  Return to the ED if you are experiencing increasing/worsening cough, trouble breathing, or worsening symptoms.    For your right leg, your ultrasound study did not show a blood clot in your leg.  You may take over the counter 600 mg ibuprofen every 6 hours or 1000 mg Tylenol every 6 hours as needed for pain.  You may apply ice or heat to affected area for up to 15 minutes at a time.  Maintain your follow-up appointment with your orthopedist, Dr. Ophelia Charter on 10/11/2021.  Return to the Emergency Department if you are experiencing increasing/worsening swelling, inability to walk, worsening symptoms.

## 2021-10-07 NOTE — ED Provider Notes (Signed)
[facility] EMERGENCY DEPARTMENT Provider Note   CSN: [id number] Arrival date & time: 10/07/21  0827     History Chief Complaint  Patient presents with   Cough   Fever   Leg Pain    David Larsen is a 36 y.o. male with no significant past medical history who presents to the ED complaining of cough and fever onset yesterday.  Patient denies sick contacts.  He has not tried any medications for his symptoms.  Denies chills, shortness of breath, chest pain, abdominal pain, nausea, vomiting.    He also has right leg pain onset 2 weeks.  He has an orthopedist that he follows him for his pain.  He has tried ibuprofen at home with no relief of his symptoms.  Denies color change, wound, rash, swelling.  Denies any new injury, trauma, heavy lifting.   Past Medical History:  Diagnosis Date   Asthma       The history is provided by the patient. No language interpreter was used.      Home Medications Prior to Admission medications   Medication Sig Start Date End Date Taking? Authorizing Provider  amoxicillin-clavulanate (AUGMENTIN) 875-125 MG tablet Take 1 tablet by mouth every 12 (twelve) hours. 01/23/20   Petrucelli, Samantha R, PA-C  amoxicillin-clavulanate (AUGMENTIN) 875-125 MG tablet Take 1 tablet by mouth 2 (two) times daily. One po bid x 7 days 11/08/20   Charlesetta Shanks, MD  colchicine 0.6 MG tablet Take 1 tablet (0.6 mg total) by mouth once for 1 dose. Try to take 1 hour after your medicine at the hospital 09/30/21 09/30/21  Deno Etienne, DO  docusate sodium (COLACE) 100 MG capsule Take 1 capsule (100 mg total) by mouth every 12 (twelve) hours. 01/03/20   Larene Pickett, PA-C  docusate sodium (COLACE) 100 MG capsule Take 1 capsule (100 mg total) by mouth every 12 (twelve) hours. 11/08/20   Charlesetta Shanks, MD  hydrocortisone (ANUSOL-HC) 2.5 % rectal cream Place 1 application rectally 3 (three) times daily as needed for hemorrhoids or anal itching. 12/19/19    Lajean Saver, MD  hydrocortisone (ANUSOL-HC) 25 MG suppository Place 1 suppository (25 mg total) rectally 2 (two) times daily. 01/03/20   Larene Pickett, PA-C  ibuprofen (ADVIL) 800 MG tablet Take 1 tablet (800 mg total) by mouth 3 (three) times daily. 11/08/20   Charlesetta Shanks, MD  Lidocaine, Anorectal, 5 % GEL Apply externally every 4-6 hours as needed for rectal pain. 01/23/20   Petrucelli, Samantha R, PA-C  methylPREDNISolone (MEDROL) 4 MG tablet 6 DAY TAPER TO BE TAKEN AS DIRECTED. 09/26/21   Lanae Crumbly, PA-C  shark liver oil-cocoa butter (PREPARATION H) 0.25-88.44 % suppository Place 1 suppository rectally as needed for hemorrhoids. 11/08/20   Charlesetta Shanks, MD  albuterol (VENTOLIN HFA) 108 (90 Base) MCG/ACT inhaler Inhale 2 puffs into the lungs every 6 (six) hours as needed for wheezing or shortness of breath. Patient not taking: Reported on 01/03/2020 05/26/19 01/23/20  Rodriguez-Southworth, Sunday Spillers, PA-C  budesonide-formoterol [facility]) 80-4.5 MCG/ACT inhaler Inhale 2 puffs into the lungs daily. Patient not taking: Reported on 01/03/2020 05/26/19 01/23/20  Rodriguez-Southworth, Sunday Spillers, PA-C  famotidine (PEPCID) 20 MG tablet Take 1 tablet (20 mg total) by mouth 2 (two) times daily. Patient not taking: Reported on 01/03/2020 12/12/16 01/23/20  Ashley Murrain, NP      Allergies    Patient has no known allergies.    Review of Systems   Review of Systems  Constitutional:  Positive for fever (Subjective). Negative for chills.  Respiratory:  Positive for cough. Negative for shortness of breath.   Cardiovascular:  Negative for chest pain.  Gastrointestinal:  Negative for abdominal pain, nausea and vomiting.  Skin:  Negative for rash.  All other systems reviewed and are negative.  Physical Exam Updated Vital Signs BP (!) 136/100 (BP Location: Left Arm)    Pulse 94    Temp 99.4 F (37.4 C) (Oral)    Resp 20    SpO2 94%  Physical Exam Vitals and nursing note reviewed.  Constitutional:       General: He is not in acute distress.    Appearance: He is not diaphoretic.  HENT:     Head: Normocephalic and atraumatic.     Mouth/Throat:     Pharynx: No oropharyngeal exudate.  Eyes:     General: No scleral icterus.    Conjunctiva/sclera: Conjunctivae normal.  Cardiovascular:     Rate and Rhythm: Normal rate and regular rhythm.     Pulses: Normal pulses.     Heart sounds: Normal heart sounds.  Pulmonary:     Effort: Pulmonary effort is normal. No respiratory distress.     Breath sounds: Normal breath sounds. No wheezing.  Abdominal:     General: Bowel sounds are normal.     Palpations: Abdomen is soft. There is no mass.     Tenderness: There is no abdominal tenderness. There is no guarding or rebound.  Musculoskeletal:        General: Normal range of motion.     Cervical back: Normal range of motion and neck supple.     Right lower leg: Tenderness present.     Left lower leg: No tenderness.     Comments: Mild tenderness to palpation noted to right calf and right Achilles.  Patient able to ambulate without assistance or difficulty.  DP and PT pulses intact bilaterally.  No overlying skin changes, effusion, erythema.  Strength and sensation intact to bilateral upper and lower extremities.  Skin:    General: Skin is warm and dry.  Neurological:     Mental Status: He is alert.  Psychiatric:        Behavior: Behavior normal.    ED Results / Procedures / Treatments   Labs (all labs ordered are listed, but only abnormal results are displayed) Labs Reviewed  RESP PANEL BY RT-PCR (FLU A&B, COVID) [id number]    EKG None  Radiology DG Chest 2 View  Result Date: 10/07/2021 CLINICAL DATA:  Fever, cough EXAM: CHEST - 2 VIEW COMPARISON:  None. FINDINGS: The heart size and mediastinal contours are within normal limits. Both lungs are clear. The visualized skeletal structures are unremarkable. IMPRESSION: No active cardiopulmonary disease. Electronically Signed   By: Elmer Picker  M.D.   On: 10/07/2021 09:07   VAS [place] LOWER EXTREMITY VENOUS (DVT) (7a-7p)  Result Date: 10/07/2021  Lower Venous DVT Study Patient Name:  David Larsen  Date of Exam:   10/07/2021 Medical Rec #: [id number]            Accession #:    [id number] Date of Birth: January 03, 1986             Patient Gender: M Patient Age:   74 years Exam Location:  [facility] Procedure:      VAS [place] LOWER EXTREMITY VENOUS (DVT) Referring Phys: Vonzella Nipple Zali Kamaka --------------------------------------------------------------------------------  Indications: Pain.  Limitations: Body habitus and poor ultrasound/tissue interface. Comparison Study: No prior study Performing  Technologist: Maudry Mayhew MHA, RDMS, RVT, RDCS  Examination Guidelines: A complete evaluation includes B-mode imaging, spectral Doppler, color Doppler, and power Doppler as needed of all accessible portions of each vessel. Bilateral testing is considered an integral part of a complete examination. Limited examinations for reoccurring indications may be performed as noted. The reflux portion of the exam is performed with the patient in reverse Trendelenburg.  +---------+---------------+---------+-----------+----------+--------------+  RIGHT     Compressibility Phasicity Spontaneity Properties Thrombus Aging  +---------+---------------+---------+-----------+----------+--------------+  CFV       Full            Yes       Yes                                    +---------+---------------+---------+-----------+----------+--------------+  SFJ       Full                                                             +---------+---------------+---------+-----------+----------+--------------+  FV Prox   Full                                                             +---------+---------------+---------+-----------+----------+--------------+  FV Mid    Full                                                              +---------+---------------+---------+-----------+----------+--------------+  FV Distal Full                                                             +---------+---------------+---------+-----------+----------+--------------+  PFV       Full                                                             +---------+---------------+---------+-----------+----------+--------------+  POP       Full            Yes       Yes                                    +---------+---------------+---------+-----------+----------+--------------+  PTV       Full                                                             +---------+---------------+---------+-----------+----------+--------------+  PERO      Full                                                             +---------+---------------+---------+-----------+----------+--------------+   +----+---------------+---------+-----------+----------+--------------+  LEFT Compressibility Phasicity Spontaneity Properties Thrombus Aging  +----+---------------+---------+-----------+----------+--------------+  CFV  Full            Yes       Yes                                    +----+---------------+---------+-----------+----------+--------------+     Summary: RIGHT: - There is no evidence of deep vein thrombosis in the lower extremity.  - No cystic structure found in the popliteal fossa.  LEFT: - No evidence of common femoral vein obstruction.  *See table(s) above for measurements and observations.    Preliminary     Procedures Procedures   Medications Ordered in ED Medications - No data to display  ED Course/ Medical Decision Making/ A&P Clinical Course as of 10/07/21 1556  Mon Oct 07, 2021  1118 Case discussed with attending, who recommends DVT study due to tachycardia, cough, leg pain. [SB]  [id number] Notified via ultrasound tech that patient's right lower extremity was negative for DVT. [SB]  1502 Patient reevaluated prior to discharge.  Patient agreeable to discharge  treatment plan.  Patient appears safe for discharge at this time. [SB]    Clinical Course User Index [SB] Geanette Buonocore A, PA-C                           Medical Decision Making  This patient presents to the ED for concern of right leg pain x2 weeks and cough and fever since yesterday, this involves an extensive number of treatment options, and is a complaint that carries with it a high risk of complications and morbidity.  The differential diagnosis includes DVT, fracture, dislocation, osteoarthritis.   Patient with right leg pain onset 2 weeks.  Patient has orthopedist who he follows with with his next appointment being on 10/11/2021.  Vital signs stable, not tachycardic or hypoxic.  Patient also with cough and fever since yesterday.  Denies sick contacts.  Differential diagnosis includes COVID, flu, viral URI with cough.  Patient afebrile.   Co morbidities that complicate the patient evaluation  Asthma   Additional history obtained: External records from outside source obtained and reviewed including patient with orthopedist surgeon Dr. Lorin Mercy who follows the patient for his bilateral leg pain.  Most recent evaluation in late December 2022.  Follow-up orthopedist evaluation on 10/11/2021.   Lab Tests: I ordered, and personally interpreted labs.  The pertinent results include: COVID and flu swab negative.   Imaging Studies ordered: I ordered imaging studies including chest x-ray without acute cardiopulmonary findings. Vascular ultrasound right DVT study without acute DVT. I independently visualized and interpreted imaging which showed negative for cardiopulmonary findings on chest x-ray.  DVT study without acute DVT. I agree with the radiologist interpretation  Problem List / ED Course:  Right leg pain: Patient has adequate follow-up with orthopedist surgeon, Dr. Lorin Mercy regarding polyarthralgia.  Patient with previous prescription for colchicine from the ED.  Cough/fever: Patient  with negative  COVID and flu swab in the ED.  Negative chest x-ray. Clinical Course as of 10/07/21 1556  Mon Oct 07, 2021  1118 Case discussed with attending, who recommends DVT study due to tachycardia, cough, leg pain. [SB]  [id number] Notified via ultrasound tech that patient's right lower extremity was negative for DVT. [SB]  1502 Patient reevaluated prior to discharge.  Patient agreeable to discharge treatment plan.  Patient appears safe for discharge at this time. [SB]    Clinical Course User Index [SB] Marymargaret Kirker A, PA-C   Reevaluation: Reevaluation at discharge, discussed discharge treatment plan with patient.  Patient agreeable at this time and notes that he would like a work note.  Social Determinants of Health: Patient is of [place]-speaking origin.   Dispostion: After consideration of the diagnostic results and the patients response to treatment, I feel that the patent would benefit from discharge home with close follow-up with orthopedic surgeon, Dr. Lorin Mercy on 10/11/2021.  Patient can be treated with conservative therapy in the outpatient setting.  Patient agreeable at this time.  Patient for safe discharge.  Follow-up as indicated discharge for work.  This chart was dictated using voice recognition software, Dragon. Despite the best efforts of this provider to proofread and correct errors, errors may still occur which can change documentation meaning.  Final Clinical Impression(s) / ED Diagnoses Final diagnoses:  Viral URI with cough  Right leg pain    Rx / DC Orders ED Discharge Orders     None         Hermena Swint A, PA-C 10/07/21 1556    [place], Alvin Critchley, DO 10/14/21 1453

## 2021-10-07 NOTE — ED Notes (Signed)
RN reviewed discharge instructions w/ pt. Results and follow up reviewed, pt had no further questions

## 2021-10-07 NOTE — Progress Notes (Signed)
Right lower extremity venous duplex completed. Refer to "CV Proc" under chart review to view preliminary results.  10/07/2021 2:53 PM Eula Fried., MHA, RVT, RDCS, RDMS

## 2021-10-11 ENCOUNTER — Encounter: Payer: Self-pay | Admitting: Orthopaedic Surgery

## 2021-10-11 ENCOUNTER — Other Ambulatory Visit: Payer: Self-pay

## 2021-10-11 ENCOUNTER — Ambulatory Visit (INDEPENDENT_AMBULATORY_CARE_PROVIDER_SITE_OTHER): Payer: 59 | Admitting: Orthopaedic Surgery

## 2021-10-11 ENCOUNTER — Ambulatory Visit: Payer: Self-pay

## 2021-10-11 VITALS — BP 151/88 | Ht 73.0 in | Wt 280.0 lb

## 2021-10-11 DIAGNOSIS — M7671 Peroneal tendinitis, right leg: Secondary | ICD-10-CM | POA: Diagnosis not present

## 2021-10-11 DIAGNOSIS — M25571 Pain in right ankle and joints of right foot: Secondary | ICD-10-CM | POA: Diagnosis not present

## 2021-10-11 NOTE — Progress Notes (Signed)
Office Visit Note   Patient: David Larsen           Date of Birth: October 09, 1985           MRN: [id number] Visit Date: 10/11/2021              Requested by: Garey Ham, PA-C [address] [place],  [place] [address] PCP: Garey Ham, PA-C   Assessment & Plan: Visit Diagnoses:  1. Pain in right ankle and joints of right foot     Plan: We will place patient in a cam boot.  Work note given no work till Monday.  Recheck 2 weeks.  Follow-Up Instructions: No follow-ups on file.   Orders:  Orders Placed This Encounter  Procedures   XR Foot Complete Right   No orders of the defined types were placed in this encounter.     Procedures: No procedures performed   Clinical Data: No additional findings.   Subjective: Chief Complaint  Patient presents with   Left Knee - Pain, Follow-up   Right Ankle - Pain, Follow-up    HPI 36 year old male returns with ongoing problems with right knee but more problems with his right foot.  He is requesting a note out of work until Monday has had trouble walking.  He was seen in the emergency room 12/26 for his left knee placed on colchicine which she states really did not seem to help much.  He had a prednisone Dosepak which did not really help.  Patient's had a uric acid which was 7.3.  Sed rate was 2.  ANA and rheumatoid factor were negative.  No fever.  No past history of pain in the first MTP joint of either feet.  He is on his feet at work a lot.  He states Motrin does seem to help to some degree point.  He points laterally over the peroneal tendon where he is having pain on the right foot none on the left.  Review of Systems all other systems noncontributory to HPI.   Objective: Vital Signs: BP (!) 151/88    Ht 6\' 1"  (1.854 m)    Wt 280 lb (127 kg)    BMI 36.94 kg/m   Physical Exam Constitutional:      Appearance: He is well-developed.  HENT:     Head: Normocephalic and atraumatic.      Right Ear: External ear normal.     Left Ear: External ear normal.  Eyes:     Pupils: Pupils are equal, round, and reactive to light.  Neck:     Thyroid: No thyromegaly.     Trachea: No tracheal deviation.  Cardiovascular:     Rate and Rhythm: Normal rate.  Pulmonary:     Effort: Pulmonary effort is normal.     Breath sounds: No wheezing.  Abdominal:     General: Bowel sounds are normal.     Palpations: Abdomen is soft.  Musculoskeletal:     Cervical back: Neck supple.  Skin:    General: Skin is warm and dry.     Capillary Refill: Capillary refill takes less than 2 seconds.  Neurological:     Mental Status: He is alert and oriented to person, place, and time.  Psychiatric:        Behavior: Behavior normal.        Thought Content: Thought content normal.        Judgment: Judgment normal.    Ortho Exam patient is amatory with a  limp with exquisite tenderness over the peroneal tendon insertion site.  No pain with resisted toe extension.  No ankle effusion.  Does not seem to be tender over the calcaneocuboid joint or subtalar motion.  Pain with resisted peroneal function.  Specialty Comments:  No specialty comments available.  Imaging: No results found.   PMFS History: There are no problems to display for this patient.  Past Medical History:  Diagnosis Date   Asthma     Family History  Problem Relation Age of Onset   Hypertension Father     Past Surgical History:  Procedure Laterality Date   HEMORRHOID SURGERY     Social History   Occupational History   Not on file  Tobacco Use   Smoking status: Never   Smokeless tobacco: Never  Vaping Use   Vaping Use: Never used  Substance and Sexual Activity   Alcohol use: No   Drug use: Never   Sexual activity: Not on file

## 2021-10-13 DIAGNOSIS — M7671 Peroneal tendinitis, right leg: Secondary | ICD-10-CM | POA: Insufficient documentation

## 2021-12-25 ENCOUNTER — Encounter: Payer: Self-pay | Admitting: Family Medicine

## 2021-12-25 ENCOUNTER — Ambulatory Visit: Payer: Self-pay

## 2021-12-25 ENCOUNTER — Ambulatory Visit (INDEPENDENT_AMBULATORY_CARE_PROVIDER_SITE_OTHER): Payer: 59 | Admitting: Family Medicine

## 2021-12-25 VITALS — BP 140/82 | Ht 73.0 in | Wt 280.0 lb

## 2021-12-25 DIAGNOSIS — M659 Synovitis and tenosynovitis, unspecified: Secondary | ICD-10-CM

## 2021-12-25 DIAGNOSIS — M25561 Pain in right knee: Secondary | ICD-10-CM

## 2021-12-25 DIAGNOSIS — M7741 Metatarsalgia, right foot: Secondary | ICD-10-CM

## 2021-12-25 DIAGNOSIS — M7742 Metatarsalgia, left foot: Secondary | ICD-10-CM

## 2021-12-25 MED ORDER — PREDNISONE 20 MG PO TABS: ORAL_TABLET | ORAL | 0 refills | Status: AC

## 2021-12-25 NOTE — Assessment & Plan Note (Signed)
Acutely occurring.  Does appear to have swelling of each foot.  Pain is in the plantar arch. ?-Counseled on home exercise therapy and supportive care. ?-Midfoot arch straps. ?-Could consider custom insoles or green sport insoles. ? ?

## 2021-12-25 NOTE — Assessment & Plan Note (Signed)
Acute on chronic in nature.  Does have a history of gout with most recent uric acid being 7.4.  Does appear to be more inflammatory in nature as opposed to being structural. ?-Counseled on home exercise therapy and supportive care. ?-Prednisone. ?-Could consider injection or further imaging. ?

## 2021-12-25 NOTE — Progress Notes (Signed)
?  David Larsen - 36 y.o. male MRN [id number]  Date of birth: July 21, 1986 ? ?SUBJECTIVE:  Including CC & ROS.  ?No chief complaint on file. ? ? ?David Larsen is a 36 y.o. male that is presenting with acute on chronic right knee pain and right and left foot pain.  The pain has been ongoing for several weeks.  He does have a history of gout.  No injury inciting event.  Pain is worse with any movement. ? ?Independent review of the right knee x-ray from 09/26/2021 shows no acute changes. ?Independent review of the right foot x-ray from 10/11/2021 shows no acute changes. ? ?Review of Systems ?See HPI  ? ?HISTORY: Past Medical, Surgical, Social, and Family History Reviewed & Updated per EMR.   ?Pertinent Historical Findings include: ? ?Past Medical History:  ?Diagnosis Date  ? Asthma   ? ? ?Past Surgical History:  ?Procedure Laterality Date  ? HEMORRHOID SURGERY    ? ? ? ?PHYSICAL EXAM:  ?VS: BP 140/82 (BP Location: Left Arm, Patient Position: Sitting)   Ht 6\' 1"  (1.854 m)   Wt 280 lb (127 kg)   BMI 36.94 kg/m?  ?Physical Exam ?Gen: NAD, alert, cooperative with exam, well-appearing ?MSK:  ?Neurovascularly intact   ? ?Limited ultrasound: Right knee: ? ?Trace effusion the suprapatellar pouch. ?Normal-appearing quadricep and patellar tendon. ?Normal-appearing medial joint space. ?Increased hyperemia over the lateral compartment as well as going proximally up the IT band ? ?Summary: Findings most consistent with synovitis ? ?Ultrasound and interpretation by , MD ? ? ? ?ASSESSMENT & PLAN:  ? ?Metatarsalgia of both feet ?Acutely occurring.  Does appear to have swelling of each foot.  Pain is in the plantar arch. ?-Counseled on home exercise therapy and supportive care. ?-Midfoot arch straps. ?-Could consider custom insoles or green sport insoles. ? ? ?Synovitis of right knee ?Acute on chronic in nature.  Does have a history of gout with most recent uric acid being 7.4.  Does appear to be more  inflammatory in nature as opposed to being structural. ?-Counseled on home exercise therapy and supportive care. ?-Prednisone. ?-Could consider injection or further imaging. ? ? ? ? ?

## 2021-12-25 NOTE — Patient Instructions (Signed)
Nice to meet you ?Please try ice as needed  ?Please try the exercises   ?Please send me a message in MyChart with any questions or updates.  ?Please see me back in 2-3 weeks.  ? ?--Dr. Jordan Likes ? ?

## 2022-01-08 ENCOUNTER — Ambulatory Visit (INDEPENDENT_AMBULATORY_CARE_PROVIDER_SITE_OTHER): Payer: 59 | Admitting: Family Medicine

## 2022-01-08 ENCOUNTER — Encounter: Payer: Self-pay | Admitting: Family Medicine

## 2022-01-08 VITALS — BP 120/82 | Ht 73.0 in | Wt 280.0 lb

## 2022-01-08 DIAGNOSIS — M7742 Metatarsalgia, left foot: Secondary | ICD-10-CM

## 2022-01-08 DIAGNOSIS — M10061 Idiopathic gout, right knee: Secondary | ICD-10-CM

## 2022-01-08 DIAGNOSIS — M7741 Metatarsalgia, right foot: Secondary | ICD-10-CM

## 2022-01-08 MED ORDER — INDOMETHACIN 50 MG PO CAPS: 50.0000 mg | ORAL_CAPSULE | Freq: Two times a day (BID) | ORAL | 1 refills | Status: AC

## 2022-01-08 NOTE — Assessment & Plan Note (Signed)
Has done well with the prednisone.  Symptoms are likely associated with gout given his inflammatory changes previously observed. ?-Counseled on home exercise therapy and supportive care. ?-Indomethacin. ?-Uric acid. ?

## 2022-01-08 NOTE — Progress Notes (Signed)
?  David Larsen - 36 y.o. male MRN [id number]  Date of birth: 09/08/1986 ? ?SUBJECTIVE:  Including CC & ROS.  ?No chief complaint on file. ? ? ?Chistian Adamou Larsen is a 36 y.o. male that is following up for his knee and foot pain.  He denies any significant pain today.  He has done well with the prednisone. ? ? ?Review of Systems ?See HPI  ? ?HISTORY: Past Medical, Surgical, Social, and Family History Reviewed & Updated per EMR.   ?Pertinent Historical Findings include: ? ?Past Medical History:  ?Diagnosis Date  ? Asthma   ? ? ?Past Surgical History:  ?Procedure Laterality Date  ? HEMORRHOID SURGERY    ? ? ? ?PHYSICAL EXAM:  ?VS: BP 120/82 (BP Location: Left Arm, Patient Position: Sitting)   Ht 6\' 1"  (1.854 m)   Wt 280 lb (127 kg)   BMI 36.94 kg/m?  ?Physical Exam ?Gen: NAD, alert, cooperative with exam, well-appearing ?MSK:  ?Neurovascularly intact   ? ? ? ? ?ASSESSMENT & PLAN:  ? ?Metatarsalgia of both feet ?Has done well with the adjustments made thus far. ?-Counseled on home exercise therapy and supportive care. ?-Could consider custom orthotics. ? ?Acute idiopathic gout of right knee ?Has done well with the prednisone.  Symptoms are likely associated with gout given his inflammatory changes previously observed. ?-Counseled on home exercise therapy and supportive care. ?-Indomethacin. ?-Uric acid. ? ? ? ? ?

## 2022-01-08 NOTE — Patient Instructions (Signed)
Good to see you ?Please use the indocin if the pain returns.  ?I will call with the results from today   ?Please send me a message in MyChart with any questions or updates.  ?Please see me back as needed.  ? ?--Dr. Jordan Likes ? ?

## 2022-01-08 NOTE — Assessment & Plan Note (Signed)
Has done well with the adjustments made thus far. ?-Counseled on home exercise therapy and supportive care. ?-Could consider custom orthotics. ?

## 2022-01-10 LAB — URIC ACID: Uric Acid: 7.1 mg/dL (ref 3.8–8.4)

## 2022-01-14 ENCOUNTER — Telehealth: Payer: Self-pay | Admitting: Family Medicine

## 2022-01-14 NOTE — Telephone Encounter (Signed)
Informed of results.  ? ?Myra Rude, MD ?[facility] ?01/14/2022, 5:16 PM ? ?

## 2022-11-01 IMAGING — CT CT PELVIS W/ CM
2 of 4 series · 16 of 46 positions shown, 18 images · IV contrast (omnipaque)
Comparison: 12/29/2019

CLINICAL DATA: Anorectal abscess versus hemorrhoids, pain for 1
year intermittently, unable to sit down

EXAM:
CT PELVIS WITH CONTRAST
TECHNIQUE: Multidetector CT imaging of the pelvis was performed using the
standard protocol following the bolus administration of intravenous
contrast.
CONTRAST:  100mL OMNIPAQUE IOHEXOL 300 MG/ML  SOLN

[Series 6: pelvis thin · axial · 0.98mm/px · z∈[+836,+1176]mm · 13 of 620 slices shown, 15 images]
[im 26/620  soft-tissue]
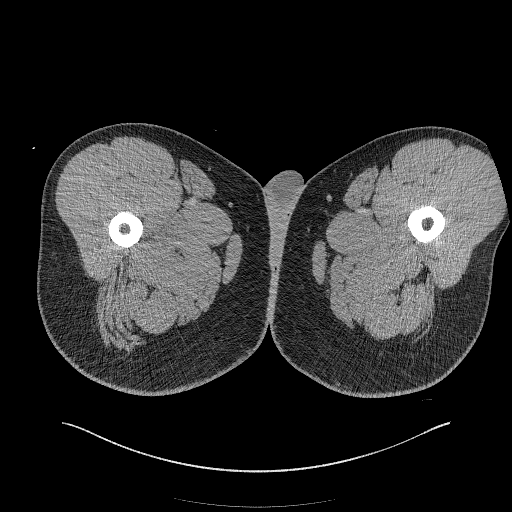
[im 26/620  bone]
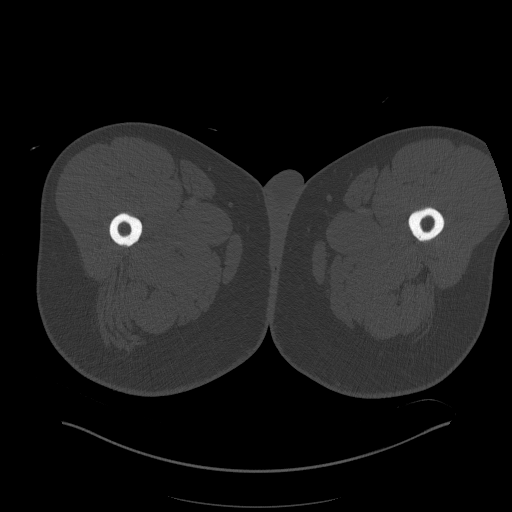
[im 78/620  soft-tissue]
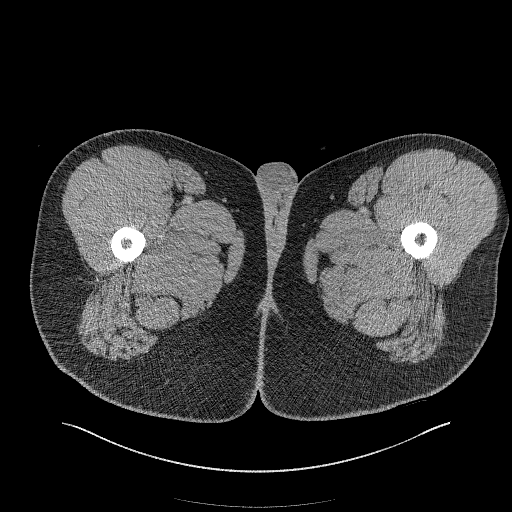
[im 129/620  soft-tissue]
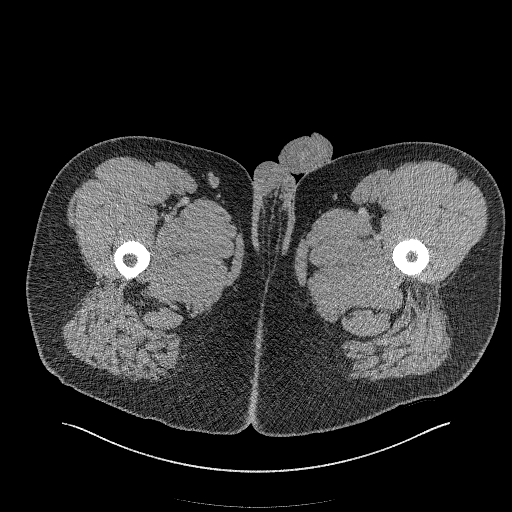
[im 181/620  soft-tissue]
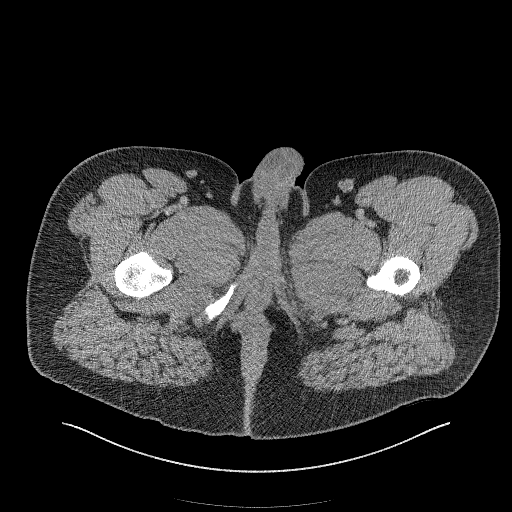
[im 207/620  soft-tissue]
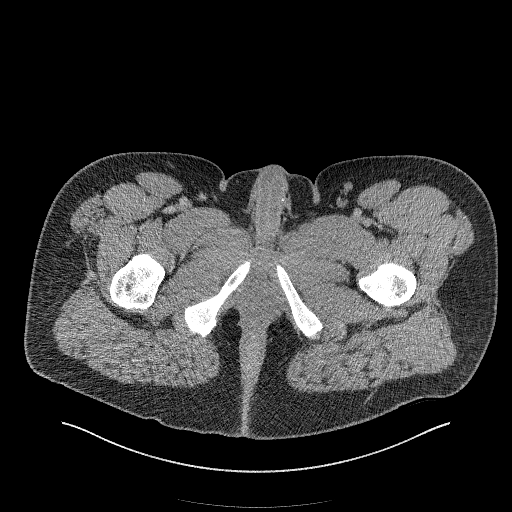
[im 258/620  soft-tissue]
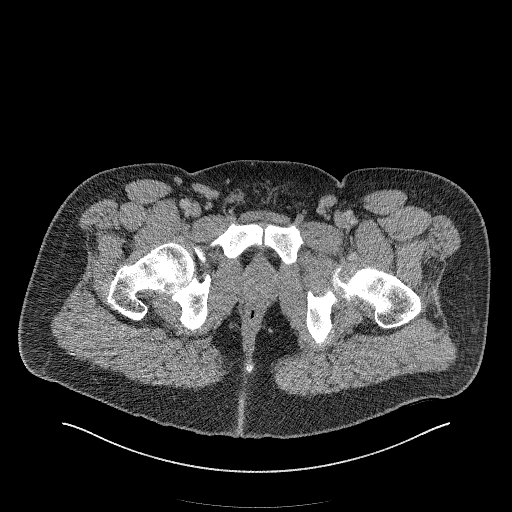
[im 310/620  soft-tissue]
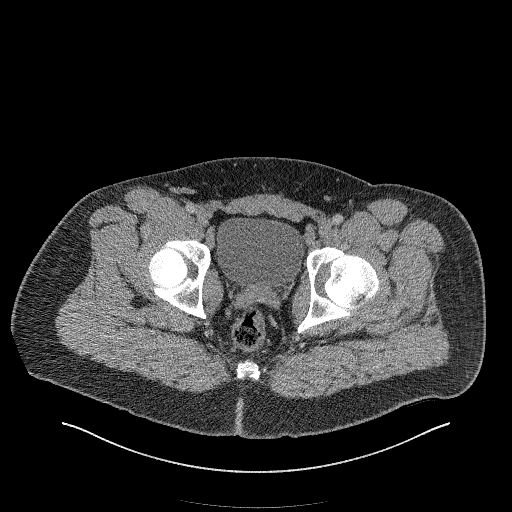
[im 362/620  soft-tissue]
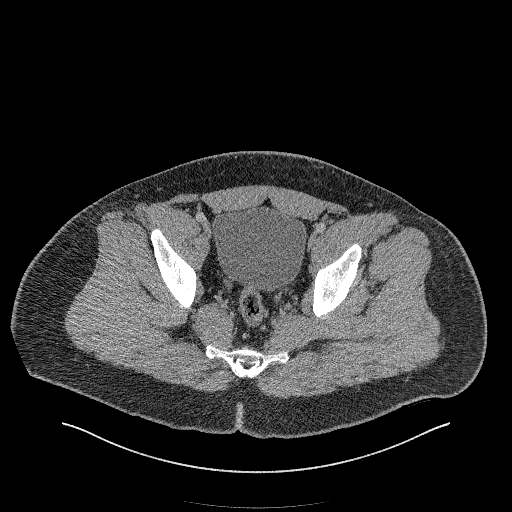
[im 413/620  soft-tissue]
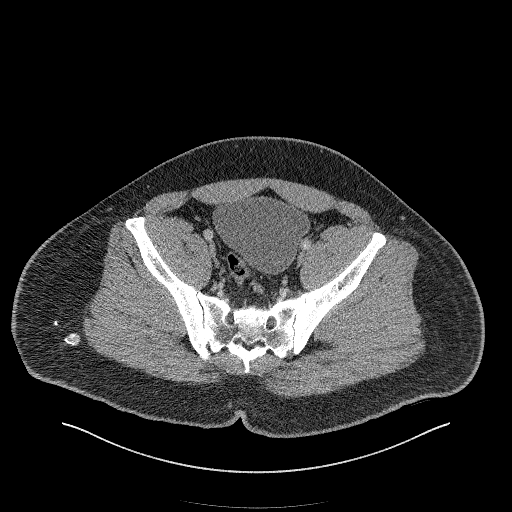
[im 413/620  bone]
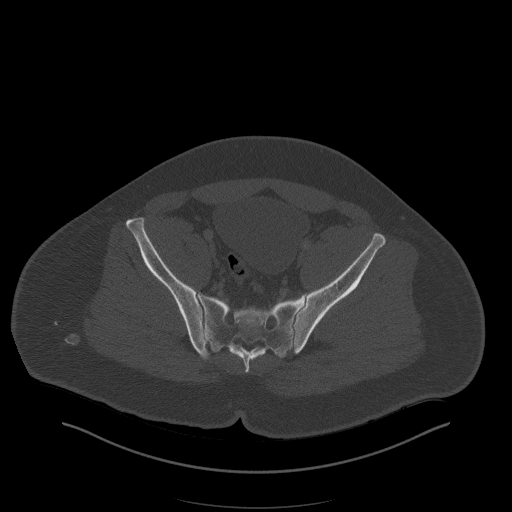
[im 439/620  soft-tissue]
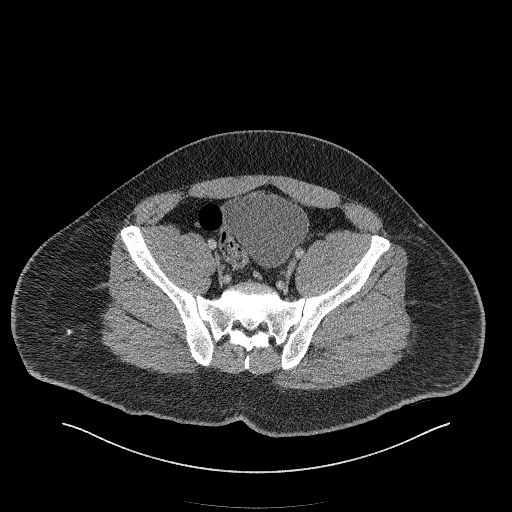
[im 491/620  soft-tissue]
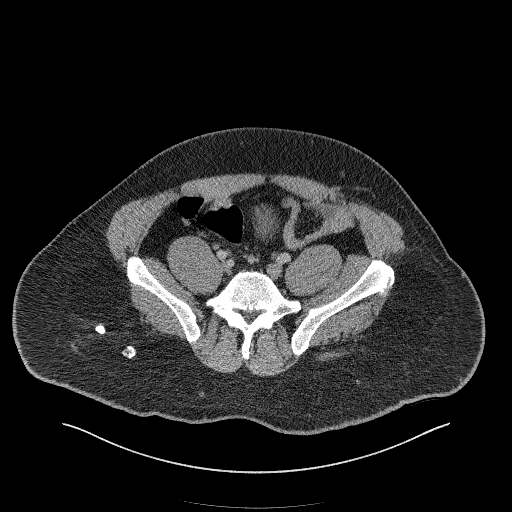
[im 542/620  soft-tissue]
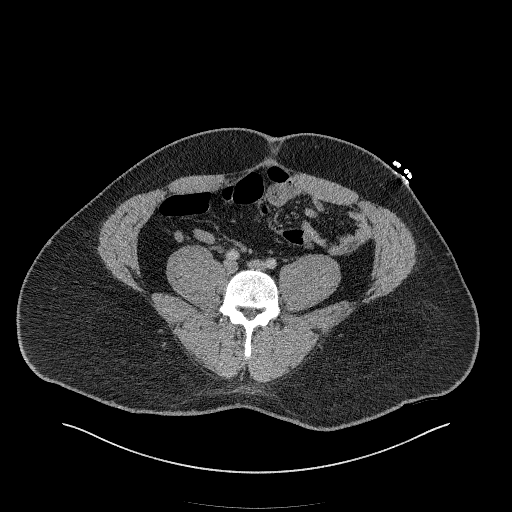
[im 594/620  soft-tissue]
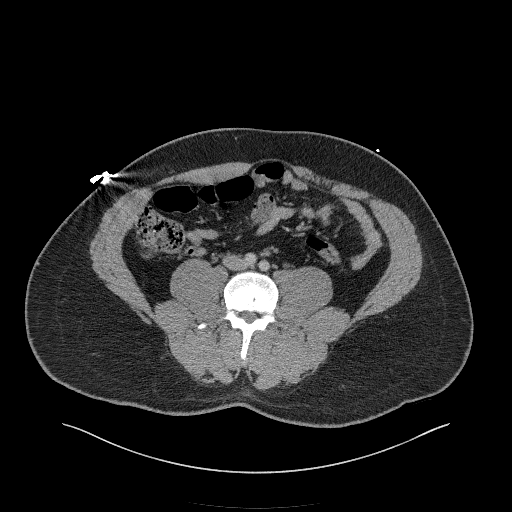

[Series 9: coronal st · coronal · 0.73mm/px · 3 of 164 slices shown]
[im 55/164  soft-tissue]
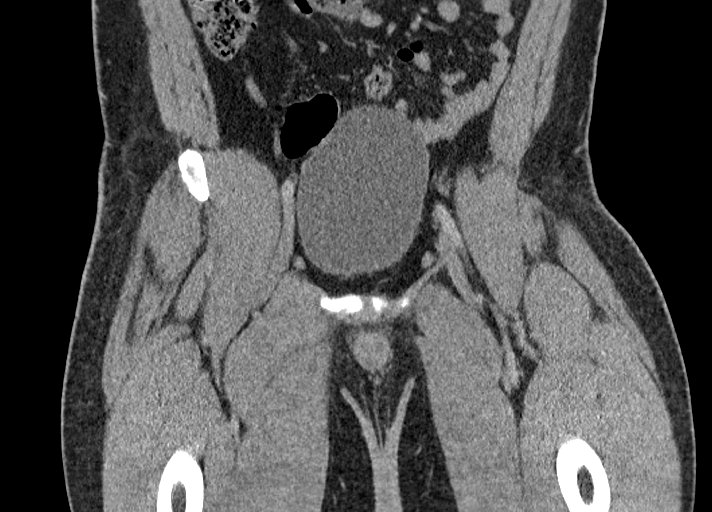
[im 73/164  soft-tissue]
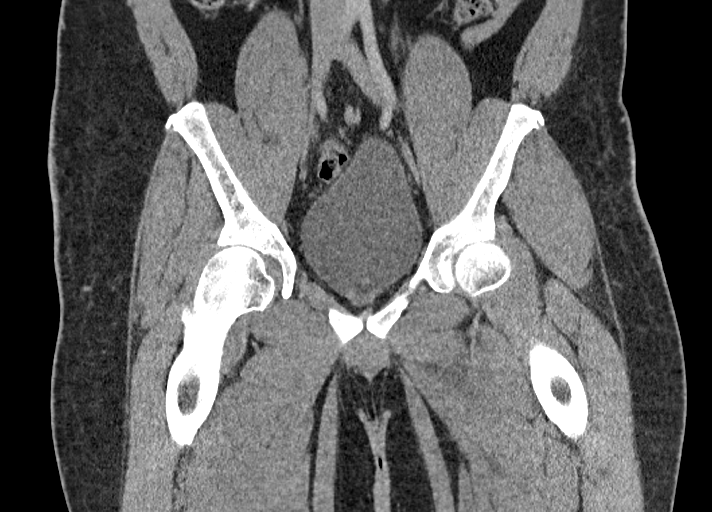
[im 91/164  soft-tissue]
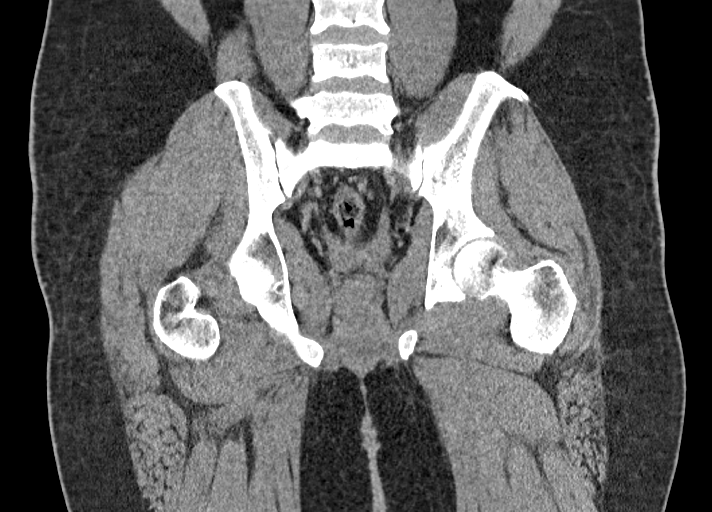

[16 of 46 positions shown; findings below may reference images not displayed]

FINDINGS: Urinary Tract:  Ureters and bladder normal appearance

Bowel: Normal appendix. Visualized large and small bowel loops
unremarkable. Specifically no perirectal infiltrative changes seen.
Perianal region unremarkable.

Vascular/Lymphatic: No pelvic adenopathy. Normal sized inguinal
nodes bilaterally. Vascular structures unremarkable. Tiny node
identified in the perirectal fat dorsally on RIGHT, 8 mm short axis.

Reproductive:  Unremarkable prostate gland and seminal vesicles

Other: No free air or free fluid. No evidence of abscess or
perianal/perirectal infiltrative changes. Multiple injection
granulomata within the buttocks bilaterally.

Musculoskeletal: Symmetric muscular planes. Osseous structures
unremarkable.
IMPRESSION: No acute intrapelvic abnormalities.

Specifically, no evidence of perirectal/perianal abscess or
inflammatory changes.

## 2023-01-19 ENCOUNTER — Encounter: Payer: Self-pay | Admitting: *Deleted
